# Patient Record
Sex: Male | Born: 1945 | Race: Black or African American | Hispanic: No | Smoking: Former smoker
Health system: Southern US, Community
[De-identification: ages and names within clinical notes are randomized; demographics above are authoritative.]

## PROBLEM LIST (undated history)

## (undated) DIAGNOSIS — L409 Psoriasis, unspecified: Secondary | ICD-10-CM

## (undated) DIAGNOSIS — I10 Essential (primary) hypertension: Secondary | ICD-10-CM

## (undated) HISTORY — PX: HERNIA REPAIR: SHX51

---

## 2010-01-25 ENCOUNTER — Emergency Department (HOSPITAL_COMMUNITY): Admission: EM | Admit: 2010-01-25 | Discharge: 2010-01-25 | Payer: Self-pay | Admitting: Emergency Medicine

## 2010-07-30 ENCOUNTER — Emergency Department (HOSPITAL_COMMUNITY): Admission: EM | Admit: 2010-07-30 | Discharge: 2010-07-30 | Payer: Self-pay | Admitting: Emergency Medicine

## 2011-01-25 LAB — RAPID URINE DRUG SCREEN, HOSP PERFORMED
Barbiturates: NOT DETECTED
Benzodiazepines: NOT DETECTED
Opiates: NOT DETECTED
Tetrahydrocannabinol: NOT DETECTED

## 2011-01-25 LAB — CBC
HCT: 40.3 % (ref 39.0–52.0)
Hemoglobin: 13.7 g/dL (ref 13.0–17.0)
RBC: 4.43 MIL/uL (ref 4.22–5.81)
WBC: 6.8 10*3/uL (ref 4.0–10.5)

## 2011-01-25 LAB — DIFFERENTIAL
Lymphocytes Relative: 42 % (ref 12–46)
Monocytes Absolute: 0.3 10*3/uL (ref 0.1–1.0)
Monocytes Relative: 5 % (ref 3–12)
Neutro Abs: 3.3 10*3/uL (ref 1.7–7.7)

## 2011-01-25 LAB — POCT CARDIAC MARKERS
CKMB, poc: <1 ng/mL — ABNORMAL LOW (ref 1.0–8.0)
Myoglobin, poc: 53.9 ng/mL (ref 12–200)

## 2011-01-25 LAB — BASIC METABOLIC PANEL
GFR calc non Af Amer: 51 mL/min — ABNORMAL LOW (ref >60)
Potassium: 3.7 mEq/L (ref 3.5–5.1)
Sodium: 135 mEq/L (ref 135–145)

## 2014-11-10 ENCOUNTER — Ambulatory Visit (HOSPITAL_COMMUNITY)
Admission: RE | Admit: 2014-11-10 | Discharge: 2014-11-10 | Disposition: A | Discharge status: Home / Self Care | Payer: Medicare Other | Source: Ambulatory Visit | Attending: Internal Medicine | Admitting: Internal Medicine

## 2014-11-10 ENCOUNTER — Other Ambulatory Visit (HOSPITAL_COMMUNITY): Payer: Self-pay | Admitting: Internal Medicine

## 2014-11-10 DIAGNOSIS — R05 Cough: Secondary | ICD-10-CM | POA: Diagnosis present

## 2014-11-10 DIAGNOSIS — J40 Bronchitis, not specified as acute or chronic: Secondary | ICD-10-CM

## 2015-02-07 DIAGNOSIS — J40 Bronchitis, not specified as acute or chronic: Secondary | ICD-10-CM | POA: Diagnosis not present

## 2015-02-07 DIAGNOSIS — M1990 Unspecified osteoarthritis, unspecified site: Secondary | ICD-10-CM | POA: Diagnosis not present

## 2015-02-09 DIAGNOSIS — Z1211 Encounter for screening for malignant neoplasm of colon: Secondary | ICD-10-CM | POA: Diagnosis not present

## 2015-05-09 DIAGNOSIS — J40 Bronchitis, not specified as acute or chronic: Secondary | ICD-10-CM | POA: Diagnosis not present

## 2015-05-09 DIAGNOSIS — M1990 Unspecified osteoarthritis, unspecified site: Secondary | ICD-10-CM | POA: Diagnosis not present

## 2015-08-08 DIAGNOSIS — J40 Bronchitis, not specified as acute or chronic: Secondary | ICD-10-CM | POA: Diagnosis not present

## 2015-08-08 DIAGNOSIS — Z23 Encounter for immunization: Secondary | ICD-10-CM | POA: Diagnosis not present

## 2015-08-08 DIAGNOSIS — M1990 Unspecified osteoarthritis, unspecified site: Secondary | ICD-10-CM | POA: Diagnosis not present

## 2015-10-17 DIAGNOSIS — J410 Simple chronic bronchitis: Secondary | ICD-10-CM | POA: Diagnosis not present

## 2015-10-17 DIAGNOSIS — Z23 Encounter for immunization: Secondary | ICD-10-CM | POA: Diagnosis not present

## 2016-05-07 ENCOUNTER — Other Ambulatory Visit (HOSPITAL_COMMUNITY): Payer: Self-pay | Admitting: Internal Medicine

## 2016-05-07 ENCOUNTER — Ambulatory Visit (HOSPITAL_COMMUNITY)
Admission: RE | Admit: 2016-05-07 | Discharge: 2016-05-07 | Disposition: A | Discharge status: Home / Self Care | Payer: Medicaid Other | Source: Ambulatory Visit | Attending: Internal Medicine | Admitting: Internal Medicine

## 2016-05-07 DIAGNOSIS — R0602 Shortness of breath: Secondary | ICD-10-CM | POA: Insufficient documentation

## 2016-05-08 ENCOUNTER — Other Ambulatory Visit (HOSPITAL_COMMUNITY): Payer: Self-pay | Admitting: Respiratory Therapy

## 2016-05-08 DIAGNOSIS — J441 Chronic obstructive pulmonary disease with (acute) exacerbation: Secondary | ICD-10-CM

## 2016-05-18 ENCOUNTER — Ambulatory Visit (HOSPITAL_COMMUNITY)
Admission: RE | Admit: 2016-05-18 | Discharge: 2016-05-18 | Disposition: A | Discharge status: Home / Self Care | Payer: Medicare Other | Source: Ambulatory Visit | Attending: Internal Medicine | Admitting: Internal Medicine

## 2016-05-18 DIAGNOSIS — J449 Chronic obstructive pulmonary disease, unspecified: Secondary | ICD-10-CM | POA: Diagnosis present

## 2016-05-18 DIAGNOSIS — J441 Chronic obstructive pulmonary disease with (acute) exacerbation: Secondary | ICD-10-CM

## 2016-05-18 LAB — SPIROMETRY WITH GRAPH
FEF 25-75 PRE: 1.26 L/s
FEF2575-%PRED-PRE: 66 %
FEV1-%Pred-Pre: 83 %
FEV1-Pre: 1.81 L
FEV1FVC-%PRED-PRE: 93 %
FEV6-%Pred-Pre: 91 %
FEV6-Pre: 2.54 L
FEV6FVC-%Pred-Pre: 105 %
FVC-%PRED-PRE: 86 %
FVC-PRE: 2.55 L
PRE FEV6/FVC RATIO: 99 %
Pre FEV1/FVC ratio: 71 %

## 2017-01-24 DIAGNOSIS — J449 Chronic obstructive pulmonary disease, unspecified: Secondary | ICD-10-CM | POA: Diagnosis not present

## 2017-01-24 DIAGNOSIS — M1990 Unspecified osteoarthritis, unspecified site: Secondary | ICD-10-CM | POA: Diagnosis not present

## 2017-01-24 DIAGNOSIS — R739 Hyperglycemia, unspecified: Secondary | ICD-10-CM | POA: Diagnosis not present

## 2017-01-24 DIAGNOSIS — J410 Simple chronic bronchitis: Secondary | ICD-10-CM | POA: Diagnosis not present

## 2017-01-24 DIAGNOSIS — I10 Essential (primary) hypertension: Secondary | ICD-10-CM | POA: Diagnosis not present

## 2017-01-24 DIAGNOSIS — C61 Malignant neoplasm of prostate: Secondary | ICD-10-CM | POA: Diagnosis not present

## 2017-05-21 DIAGNOSIS — I10 Essential (primary) hypertension: Secondary | ICD-10-CM | POA: Diagnosis not present

## 2017-05-21 DIAGNOSIS — J449 Chronic obstructive pulmonary disease, unspecified: Secondary | ICD-10-CM | POA: Diagnosis not present

## 2017-08-20 DIAGNOSIS — Z23 Encounter for immunization: Secondary | ICD-10-CM | POA: Diagnosis not present

## 2017-08-20 DIAGNOSIS — I10 Essential (primary) hypertension: Secondary | ICD-10-CM | POA: Diagnosis not present

## 2017-08-20 DIAGNOSIS — L259 Unspecified contact dermatitis, unspecified cause: Secondary | ICD-10-CM | POA: Diagnosis not present

## 2017-08-20 DIAGNOSIS — J449 Chronic obstructive pulmonary disease, unspecified: Secondary | ICD-10-CM | POA: Diagnosis not present

## 2017-08-30 IMAGING — DX DG CHEST 2V
2 series · 2 of 2 positions shown · non-contrast
Comparison: PA and lateral chest x-ray [DATE]

CLINICAL DATA: Recent onset of shortness of breath, productive
cough, and chest tightness; current smoker.

EXAM:
CHEST  2 VIEW

[chest pa]
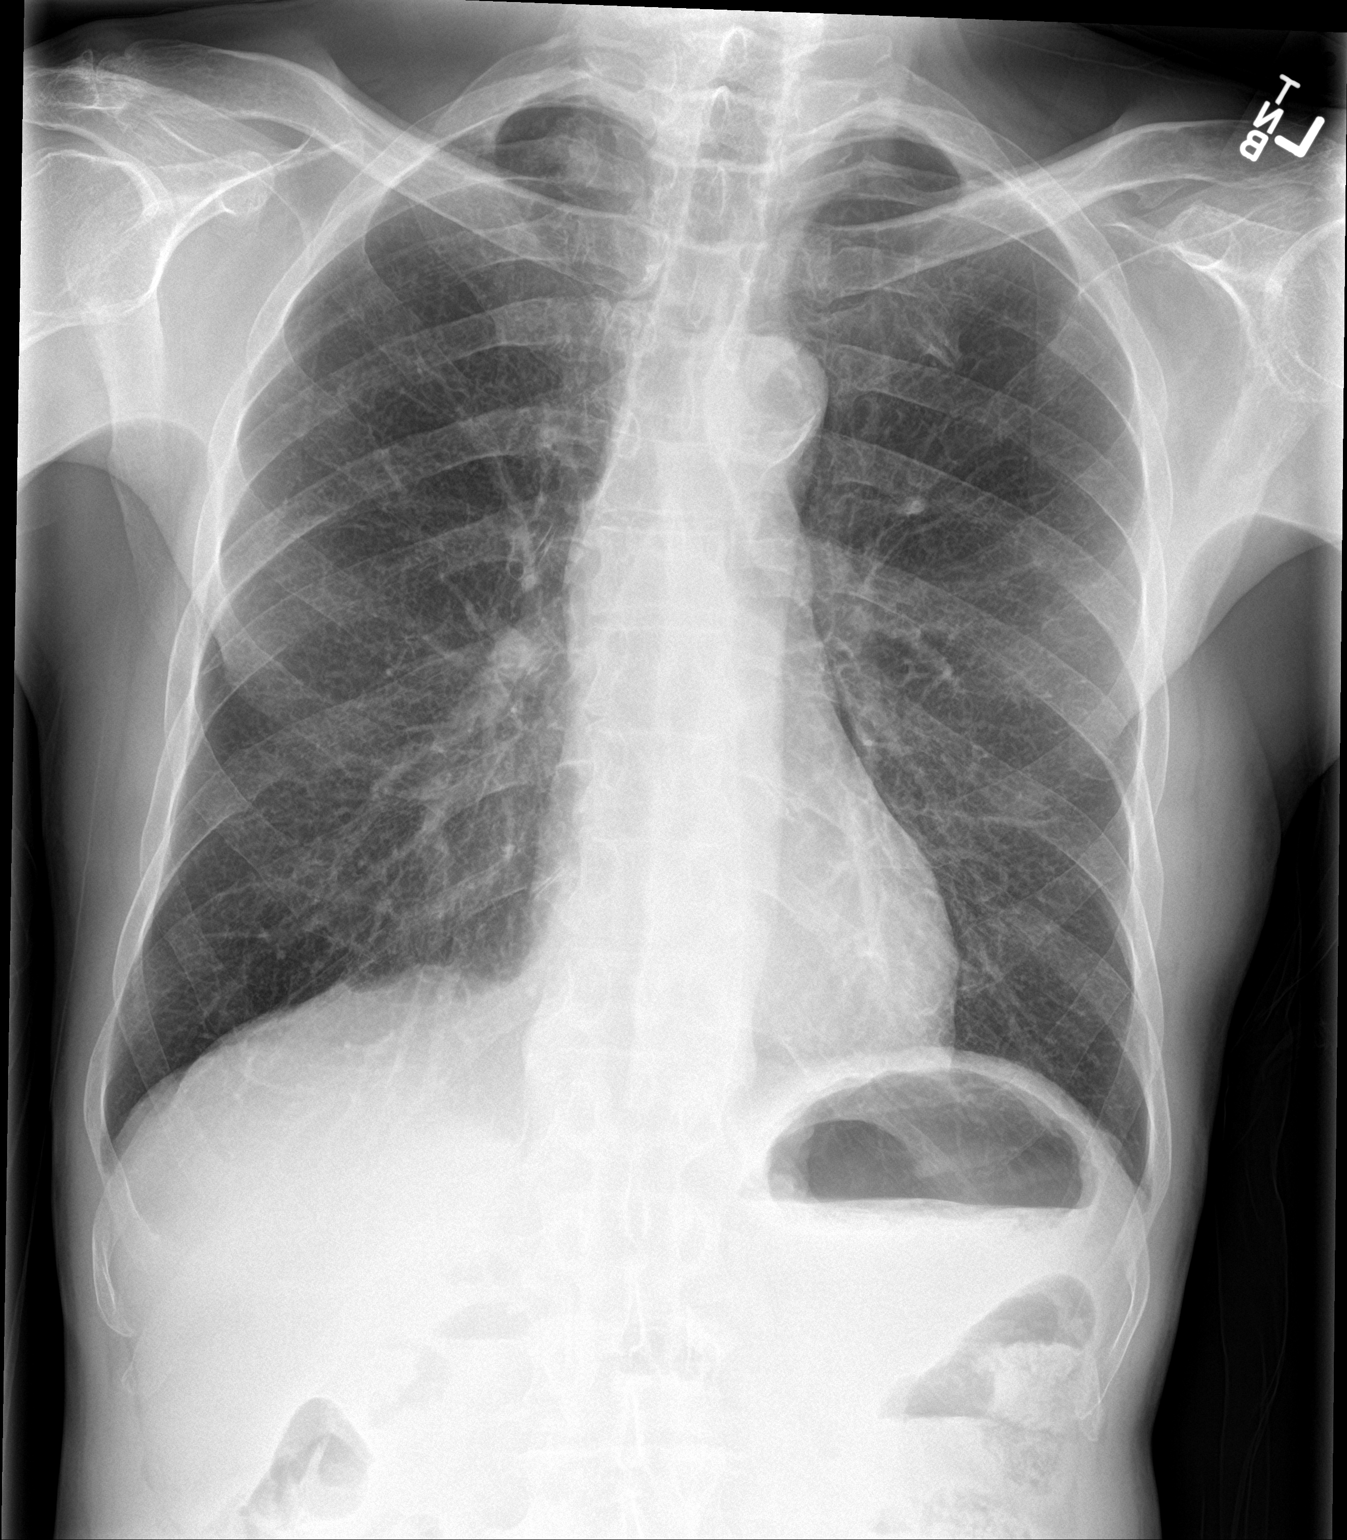

[chest lat]
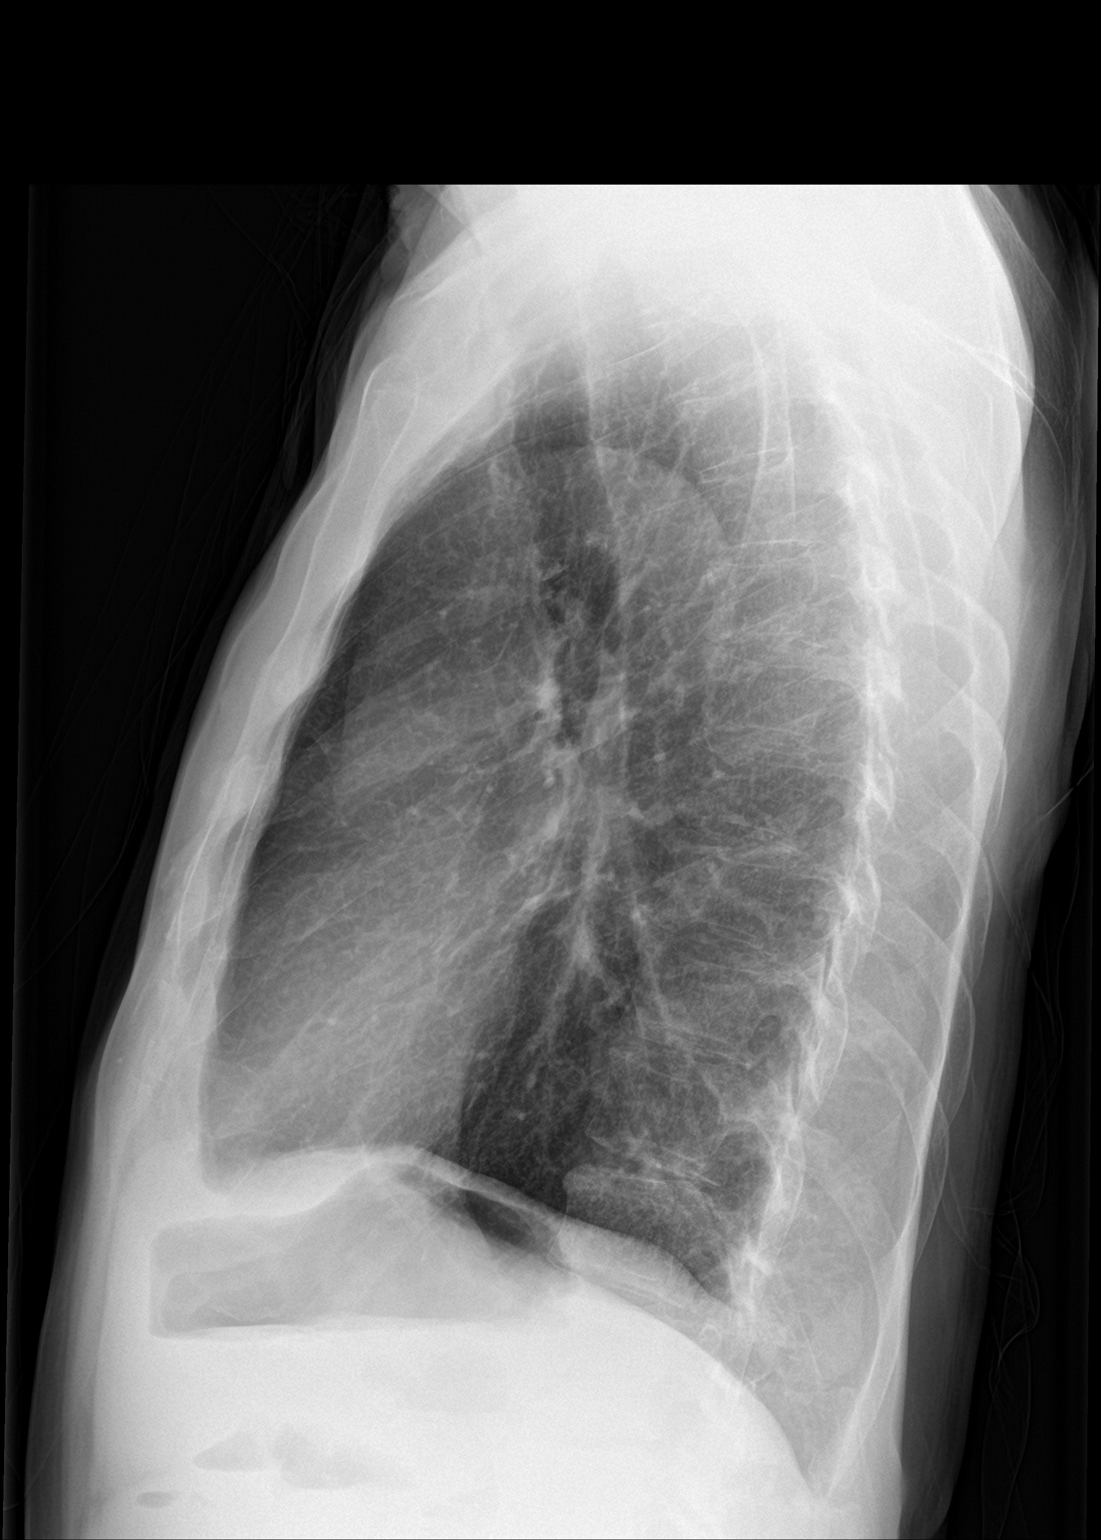

[2 of 2 positions shown; findings below may reference images not displayed]

FINDINGS: The lungs are mildly hyperinflated. The interstitial markings are
mildly increased. The heart and pulmonary vascularity are normal.
The mediastinum is normal in width. There is calcification in the
wall of the aortic arch. There is no pleural effusion. The bony
thorax exhibits no acute abnormality.
IMPRESSION: COPD/ reactive airway disease.  No evidence of pneumonia nor CHF.

Aortic atherosclerosis.

## 2017-12-10 DIAGNOSIS — J449 Chronic obstructive pulmonary disease, unspecified: Secondary | ICD-10-CM | POA: Diagnosis not present

## 2017-12-10 DIAGNOSIS — J410 Simple chronic bronchitis: Secondary | ICD-10-CM | POA: Diagnosis not present

## 2017-12-10 DIAGNOSIS — Z1331 Encounter for screening for depression: Secondary | ICD-10-CM | POA: Diagnosis not present

## 2017-12-10 DIAGNOSIS — C61 Malignant neoplasm of prostate: Secondary | ICD-10-CM | POA: Diagnosis not present

## 2017-12-10 DIAGNOSIS — Z1389 Encounter for screening for other disorder: Secondary | ICD-10-CM | POA: Diagnosis not present

## 2017-12-10 DIAGNOSIS — Z0000 Encounter for general adult medical examination without abnormal findings: Secondary | ICD-10-CM | POA: Diagnosis not present

## 2017-12-10 DIAGNOSIS — Z79899 Other long term (current) drug therapy: Secondary | ICD-10-CM | POA: Diagnosis not present

## 2017-12-10 DIAGNOSIS — I10 Essential (primary) hypertension: Secondary | ICD-10-CM | POA: Diagnosis not present

## 2017-12-10 DIAGNOSIS — Z0001 Encounter for general adult medical examination with abnormal findings: Secondary | ICD-10-CM | POA: Diagnosis not present

## 2017-12-10 DIAGNOSIS — R739 Hyperglycemia, unspecified: Secondary | ICD-10-CM | POA: Diagnosis not present

## 2017-12-10 DIAGNOSIS — M1990 Unspecified osteoarthritis, unspecified site: Secondary | ICD-10-CM | POA: Diagnosis not present

## 2017-12-26 ENCOUNTER — Ambulatory Visit (HOSPITAL_COMMUNITY)
Admission: RE | Admit: 2017-12-26 | Discharge: 2017-12-26 | Disposition: A | Discharge status: Home / Self Care | Payer: Medicare Other | Source: Ambulatory Visit | Attending: Internal Medicine | Admitting: Internal Medicine

## 2017-12-26 ENCOUNTER — Other Ambulatory Visit (HOSPITAL_COMMUNITY): Payer: Self-pay | Admitting: Internal Medicine

## 2017-12-26 DIAGNOSIS — M25551 Pain in right hip: Secondary | ICD-10-CM

## 2017-12-27 ENCOUNTER — Other Ambulatory Visit (HOSPITAL_COMMUNITY): Payer: Self-pay | Admitting: Internal Medicine

## 2017-12-27 DIAGNOSIS — M25551 Pain in right hip: Secondary | ICD-10-CM

## 2018-01-03 ENCOUNTER — Ambulatory Visit (HOSPITAL_COMMUNITY): Admission: RE | Admit: 2018-01-03 | Payer: Medicare Other | Source: Ambulatory Visit

## 2018-03-11 DIAGNOSIS — J449 Chronic obstructive pulmonary disease, unspecified: Secondary | ICD-10-CM | POA: Diagnosis not present

## 2018-03-11 DIAGNOSIS — I10 Essential (primary) hypertension: Secondary | ICD-10-CM | POA: Diagnosis not present

## 2018-06-10 DIAGNOSIS — J449 Chronic obstructive pulmonary disease, unspecified: Secondary | ICD-10-CM | POA: Diagnosis not present

## 2018-06-10 DIAGNOSIS — M1990 Unspecified osteoarthritis, unspecified site: Secondary | ICD-10-CM | POA: Diagnosis not present

## 2018-08-25 DIAGNOSIS — Z23 Encounter for immunization: Secondary | ICD-10-CM | POA: Diagnosis not present

## 2018-09-11 ENCOUNTER — Other Ambulatory Visit (HOSPITAL_COMMUNITY): Payer: Self-pay | Admitting: Internal Medicine

## 2018-09-11 DIAGNOSIS — J449 Chronic obstructive pulmonary disease, unspecified: Secondary | ICD-10-CM

## 2018-09-11 DIAGNOSIS — I10 Essential (primary) hypertension: Secondary | ICD-10-CM | POA: Diagnosis not present

## 2018-09-11 DIAGNOSIS — I719 Aortic aneurysm of unspecified site, without rupture: Secondary | ICD-10-CM

## 2018-09-17 ENCOUNTER — Ambulatory Visit (HOSPITAL_COMMUNITY)
Admission: RE | Admit: 2018-09-17 | Discharge: 2018-09-17 | Disposition: A | Discharge status: Home / Self Care | Payer: Medicare Other | Source: Ambulatory Visit | Attending: Internal Medicine | Admitting: Internal Medicine

## 2018-09-17 ENCOUNTER — Ambulatory Visit (HOSPITAL_COMMUNITY): Payer: Medicare Other

## 2018-09-17 DIAGNOSIS — J449 Chronic obstructive pulmonary disease, unspecified: Secondary | ICD-10-CM | POA: Insufficient documentation

## 2018-09-17 DIAGNOSIS — I700 Atherosclerosis of aorta: Secondary | ICD-10-CM | POA: Diagnosis not present

## 2018-09-17 DIAGNOSIS — Z122 Encounter for screening for malignant neoplasm of respiratory organs: Secondary | ICD-10-CM | POA: Insufficient documentation

## 2018-09-17 DIAGNOSIS — J439 Emphysema, unspecified: Secondary | ICD-10-CM | POA: Insufficient documentation

## 2018-09-17 DIAGNOSIS — Z136 Encounter for screening for cardiovascular disorders: Secondary | ICD-10-CM | POA: Diagnosis not present

## 2018-09-17 DIAGNOSIS — I719 Aortic aneurysm of unspecified site, without rupture: Secondary | ICD-10-CM

## 2018-09-24 DIAGNOSIS — I719 Aortic aneurysm of unspecified site, without rupture: Secondary | ICD-10-CM | POA: Diagnosis not present

## 2018-09-24 DIAGNOSIS — J449 Chronic obstructive pulmonary disease, unspecified: Secondary | ICD-10-CM | POA: Diagnosis not present

## 2018-10-01 ENCOUNTER — Ambulatory Visit (HOSPITAL_COMMUNITY): Payer: Medicare Other

## 2018-12-12 DIAGNOSIS — Z1389 Encounter for screening for other disorder: Secondary | ICD-10-CM | POA: Diagnosis not present

## 2018-12-12 DIAGNOSIS — I719 Aortic aneurysm of unspecified site, without rupture: Secondary | ICD-10-CM | POA: Diagnosis not present

## 2018-12-12 DIAGNOSIS — J410 Simple chronic bronchitis: Secondary | ICD-10-CM | POA: Diagnosis not present

## 2018-12-12 DIAGNOSIS — Z79899 Other long term (current) drug therapy: Secondary | ICD-10-CM | POA: Diagnosis not present

## 2018-12-12 DIAGNOSIS — M1990 Unspecified osteoarthritis, unspecified site: Secondary | ICD-10-CM | POA: Diagnosis not present

## 2018-12-12 DIAGNOSIS — R739 Hyperglycemia, unspecified: Secondary | ICD-10-CM | POA: Diagnosis not present

## 2018-12-12 DIAGNOSIS — J449 Chronic obstructive pulmonary disease, unspecified: Secondary | ICD-10-CM | POA: Diagnosis not present

## 2018-12-12 DIAGNOSIS — Z0000 Encounter for general adult medical examination without abnormal findings: Secondary | ICD-10-CM | POA: Diagnosis not present

## 2018-12-12 DIAGNOSIS — I10 Essential (primary) hypertension: Secondary | ICD-10-CM | POA: Diagnosis not present

## 2019-03-13 DIAGNOSIS — I10 Essential (primary) hypertension: Secondary | ICD-10-CM | POA: Diagnosis not present

## 2019-03-13 DIAGNOSIS — I719 Aortic aneurysm of unspecified site, without rupture: Secondary | ICD-10-CM | POA: Diagnosis not present

## 2019-03-13 DIAGNOSIS — J449 Chronic obstructive pulmonary disease, unspecified: Secondary | ICD-10-CM | POA: Diagnosis not present

## 2019-06-10 DIAGNOSIS — J449 Chronic obstructive pulmonary disease, unspecified: Secondary | ICD-10-CM | POA: Diagnosis not present

## 2019-06-10 DIAGNOSIS — I10 Essential (primary) hypertension: Secondary | ICD-10-CM | POA: Diagnosis not present

## 2019-06-10 DIAGNOSIS — I719 Aortic aneurysm of unspecified site, without rupture: Secondary | ICD-10-CM | POA: Diagnosis not present

## 2019-07-06 ENCOUNTER — Other Ambulatory Visit: Payer: Self-pay

## 2019-07-06 DIAGNOSIS — R6889 Other general symptoms and signs: Secondary | ICD-10-CM | POA: Diagnosis not present

## 2019-07-06 DIAGNOSIS — Z20822 Contact with and (suspected) exposure to covid-19: Secondary | ICD-10-CM

## 2019-07-07 LAB — NOVEL CORONAVIRUS, NAA: SARS-CoV-2, NAA: NOT DETECTED

## 2019-07-11 DIAGNOSIS — J449 Chronic obstructive pulmonary disease, unspecified: Secondary | ICD-10-CM | POA: Diagnosis not present

## 2019-07-11 DIAGNOSIS — I10 Essential (primary) hypertension: Secondary | ICD-10-CM | POA: Diagnosis not present

## 2019-08-11 DIAGNOSIS — J449 Chronic obstructive pulmonary disease, unspecified: Secondary | ICD-10-CM | POA: Diagnosis not present

## 2019-08-11 DIAGNOSIS — I10 Essential (primary) hypertension: Secondary | ICD-10-CM | POA: Diagnosis not present

## 2019-09-10 DIAGNOSIS — M1990 Unspecified osteoarthritis, unspecified site: Secondary | ICD-10-CM | POA: Diagnosis not present

## 2019-09-10 DIAGNOSIS — I10 Essential (primary) hypertension: Secondary | ICD-10-CM | POA: Diagnosis not present

## 2019-10-11 DIAGNOSIS — J449 Chronic obstructive pulmonary disease, unspecified: Secondary | ICD-10-CM | POA: Diagnosis not present

## 2019-10-11 DIAGNOSIS — I10 Essential (primary) hypertension: Secondary | ICD-10-CM | POA: Diagnosis not present

## 2019-10-27 DIAGNOSIS — I719 Aortic aneurysm of unspecified site, without rupture: Secondary | ICD-10-CM | POA: Diagnosis not present

## 2019-10-27 DIAGNOSIS — J449 Chronic obstructive pulmonary disease, unspecified: Secondary | ICD-10-CM | POA: Diagnosis not present

## 2019-10-27 DIAGNOSIS — M1990 Unspecified osteoarthritis, unspecified site: Secondary | ICD-10-CM | POA: Diagnosis not present

## 2019-10-27 DIAGNOSIS — I10 Essential (primary) hypertension: Secondary | ICD-10-CM | POA: Diagnosis not present

## 2019-11-16 DIAGNOSIS — I10 Essential (primary) hypertension: Secondary | ICD-10-CM | POA: Diagnosis not present

## 2020-01-07 DIAGNOSIS — F17210 Nicotine dependence, cigarettes, uncomplicated: Secondary | ICD-10-CM | POA: Diagnosis not present

## 2020-01-07 DIAGNOSIS — Z1389 Encounter for screening for other disorder: Secondary | ICD-10-CM | POA: Diagnosis not present

## 2020-01-07 DIAGNOSIS — Z0001 Encounter for general adult medical examination with abnormal findings: Secondary | ICD-10-CM | POA: Diagnosis not present

## 2020-01-07 DIAGNOSIS — I10 Essential (primary) hypertension: Secondary | ICD-10-CM | POA: Diagnosis not present

## 2020-01-07 DIAGNOSIS — I719 Aortic aneurysm of unspecified site, without rupture: Secondary | ICD-10-CM | POA: Diagnosis not present

## 2020-01-08 DIAGNOSIS — J449 Chronic obstructive pulmonary disease, unspecified: Secondary | ICD-10-CM | POA: Diagnosis not present

## 2020-01-08 DIAGNOSIS — I10 Essential (primary) hypertension: Secondary | ICD-10-CM | POA: Diagnosis not present

## 2020-01-08 DIAGNOSIS — Z0001 Encounter for general adult medical examination with abnormal findings: Secondary | ICD-10-CM | POA: Diagnosis not present

## 2020-01-23 ENCOUNTER — Ambulatory Visit: Payer: Medicare Other

## 2020-02-04 ENCOUNTER — Ambulatory Visit: Payer: Medicare Other | Attending: Internal Medicine

## 2020-02-04 DIAGNOSIS — I10 Essential (primary) hypertension: Secondary | ICD-10-CM | POA: Diagnosis not present

## 2020-02-04 DIAGNOSIS — Z23 Encounter for immunization: Secondary | ICD-10-CM

## 2020-02-04 DIAGNOSIS — J449 Chronic obstructive pulmonary disease, unspecified: Secondary | ICD-10-CM | POA: Diagnosis not present

## 2020-02-04 NOTE — Progress Notes (Signed)
   Covid-19 Vaccination Clinic  Name:  Kenneth [name]    MRN: [id number] DOB: [birth year]  02/04/2020  Mr. [name] was observed post Covid-19 immunization for 15 minutes without incident. He was provided with Vaccine Information Sheet and instruction to access the V-Safe system.   [name] was instructed to call 911 with any severe reactions post vaccine: Marland Kitchen Difficulty breathing  . Swelling of face and throat  . A fast heartbeat  . A bad rash all over body  . Dizziness and weakness   Immunizations Administered    Name Date Dose VIS Date Route   Moderna COVID-19 Vaccine 02/04/2020 11:33 AM 0.5 mL 10/13/2019 Intramuscular   Manufacturer: Moderna   Lot: [id number]   [id number]

## 2020-03-03 ENCOUNTER — Ambulatory Visit: Payer: Medicare Other | Attending: Internal Medicine

## 2020-03-03 DIAGNOSIS — Z23 Encounter for immunization: Secondary | ICD-10-CM

## 2020-03-03 NOTE — Progress Notes (Signed)
   Covid-19 Vaccination Clinic  Name:  Jaryn Zetterberg    MRN: [id number] DOB: 09/24/1946  03/03/2020  Mr. Farias was observed post Covid-19 immunization for 15 minutes without incident. He was provided with Vaccine Information Sheet and instruction to access the V-Safe system.   Mr. Ings was instructed to call 911 with any severe reactions post vaccine: Marland Kitchen Difficulty breathing  . Swelling of face and throat  . A fast heartbeat  . A bad rash all over body  . Dizziness and weakness   Immunizations Administered    Name Date Dose VIS Date Route   Moderna COVID-19 Vaccine 03/03/2020 11:01 AM 0.5 mL 10/2019 Intramuscular   Manufacturer: Moderna   Lot: [id number]   [id number]

## 2020-03-06 DIAGNOSIS — M1990 Unspecified osteoarthritis, unspecified site: Secondary | ICD-10-CM | POA: Diagnosis not present

## 2020-03-06 DIAGNOSIS — I10 Essential (primary) hypertension: Secondary | ICD-10-CM | POA: Diagnosis not present

## 2020-04-05 DIAGNOSIS — I10 Essential (primary) hypertension: Secondary | ICD-10-CM | POA: Diagnosis not present

## 2020-04-05 DIAGNOSIS — M1990 Unspecified osteoarthritis, unspecified site: Secondary | ICD-10-CM | POA: Diagnosis not present

## 2020-04-15 DIAGNOSIS — M1990 Unspecified osteoarthritis, unspecified site: Secondary | ICD-10-CM | POA: Diagnosis not present

## 2020-04-15 DIAGNOSIS — I714 Abdominal aortic aneurysm, without rupture: Secondary | ICD-10-CM | POA: Diagnosis not present

## 2020-04-15 DIAGNOSIS — J449 Chronic obstructive pulmonary disease, unspecified: Secondary | ICD-10-CM | POA: Diagnosis not present

## 2020-05-15 DIAGNOSIS — J449 Chronic obstructive pulmonary disease, unspecified: Secondary | ICD-10-CM | POA: Diagnosis not present

## 2020-05-15 DIAGNOSIS — I10 Essential (primary) hypertension: Secondary | ICD-10-CM | POA: Diagnosis not present

## 2020-06-15 DIAGNOSIS — J449 Chronic obstructive pulmonary disease, unspecified: Secondary | ICD-10-CM | POA: Diagnosis not present

## 2020-06-15 DIAGNOSIS — I10 Essential (primary) hypertension: Secondary | ICD-10-CM | POA: Diagnosis not present

## 2020-07-28 DIAGNOSIS — Z23 Encounter for immunization: Secondary | ICD-10-CM | POA: Diagnosis not present

## 2020-07-28 DIAGNOSIS — J449 Chronic obstructive pulmonary disease, unspecified: Secondary | ICD-10-CM | POA: Diagnosis not present

## 2020-07-28 DIAGNOSIS — I10 Essential (primary) hypertension: Secondary | ICD-10-CM | POA: Diagnosis not present

## 2020-07-28 DIAGNOSIS — F17200 Nicotine dependence, unspecified, uncomplicated: Secondary | ICD-10-CM | POA: Diagnosis not present

## 2020-08-27 DIAGNOSIS — J449 Chronic obstructive pulmonary disease, unspecified: Secondary | ICD-10-CM | POA: Diagnosis not present

## 2020-08-27 DIAGNOSIS — M1990 Unspecified osteoarthritis, unspecified site: Secondary | ICD-10-CM | POA: Diagnosis not present

## 2020-09-27 DIAGNOSIS — I10 Essential (primary) hypertension: Secondary | ICD-10-CM | POA: Diagnosis not present

## 2020-09-27 DIAGNOSIS — J449 Chronic obstructive pulmonary disease, unspecified: Secondary | ICD-10-CM | POA: Diagnosis not present

## 2020-11-02 DIAGNOSIS — J449 Chronic obstructive pulmonary disease, unspecified: Secondary | ICD-10-CM | POA: Diagnosis not present

## 2020-11-02 DIAGNOSIS — I10 Essential (primary) hypertension: Secondary | ICD-10-CM | POA: Diagnosis not present

## 2020-12-03 DIAGNOSIS — J449 Chronic obstructive pulmonary disease, unspecified: Secondary | ICD-10-CM | POA: Diagnosis not present

## 2020-12-03 DIAGNOSIS — F17200 Nicotine dependence, unspecified, uncomplicated: Secondary | ICD-10-CM | POA: Diagnosis not present

## 2021-01-09 DIAGNOSIS — I719 Aortic aneurysm of unspecified site, without rupture: Secondary | ICD-10-CM | POA: Diagnosis not present

## 2021-01-09 DIAGNOSIS — Z0001 Encounter for general adult medical examination with abnormal findings: Secondary | ICD-10-CM | POA: Diagnosis not present

## 2021-01-09 DIAGNOSIS — I10 Essential (primary) hypertension: Secondary | ICD-10-CM | POA: Diagnosis not present

## 2021-01-09 DIAGNOSIS — Z1389 Encounter for screening for other disorder: Secondary | ICD-10-CM | POA: Diagnosis not present

## 2021-01-09 DIAGNOSIS — J449 Chronic obstructive pulmonary disease, unspecified: Secondary | ICD-10-CM | POA: Diagnosis not present

## 2021-01-09 DIAGNOSIS — F17210 Nicotine dependence, cigarettes, uncomplicated: Secondary | ICD-10-CM | POA: Diagnosis not present

## 2021-01-10 DIAGNOSIS — Z0001 Encounter for general adult medical examination with abnormal findings: Secondary | ICD-10-CM | POA: Diagnosis not present

## 2021-01-10 DIAGNOSIS — I10 Essential (primary) hypertension: Secondary | ICD-10-CM | POA: Diagnosis not present

## 2021-01-10 DIAGNOSIS — Z79899 Other long term (current) drug therapy: Secondary | ICD-10-CM | POA: Diagnosis not present

## 2021-02-06 DIAGNOSIS — I10 Essential (primary) hypertension: Secondary | ICD-10-CM | POA: Diagnosis not present

## 2021-02-06 DIAGNOSIS — J449 Chronic obstructive pulmonary disease, unspecified: Secondary | ICD-10-CM | POA: Diagnosis not present

## 2021-03-09 DIAGNOSIS — F17210 Nicotine dependence, cigarettes, uncomplicated: Secondary | ICD-10-CM | POA: Diagnosis not present

## 2021-03-09 DIAGNOSIS — J449 Chronic obstructive pulmonary disease, unspecified: Secondary | ICD-10-CM | POA: Diagnosis not present

## 2021-04-08 DIAGNOSIS — F17200 Nicotine dependence, unspecified, uncomplicated: Secondary | ICD-10-CM | POA: Diagnosis not present

## 2021-04-08 DIAGNOSIS — I10 Essential (primary) hypertension: Secondary | ICD-10-CM | POA: Diagnosis not present

## 2021-05-09 DIAGNOSIS — J449 Chronic obstructive pulmonary disease, unspecified: Secondary | ICD-10-CM | POA: Diagnosis not present

## 2021-05-09 DIAGNOSIS — F17210 Nicotine dependence, cigarettes, uncomplicated: Secondary | ICD-10-CM | POA: Diagnosis not present

## 2021-06-08 DIAGNOSIS — F17210 Nicotine dependence, cigarettes, uncomplicated: Secondary | ICD-10-CM | POA: Diagnosis not present

## 2021-06-08 DIAGNOSIS — I10 Essential (primary) hypertension: Secondary | ICD-10-CM | POA: Diagnosis not present

## 2021-06-27 DIAGNOSIS — I10 Essential (primary) hypertension: Secondary | ICD-10-CM | POA: Diagnosis not present

## 2021-06-27 DIAGNOSIS — F17200 Nicotine dependence, unspecified, uncomplicated: Secondary | ICD-10-CM | POA: Diagnosis not present

## 2021-06-27 DIAGNOSIS — J449 Chronic obstructive pulmonary disease, unspecified: Secondary | ICD-10-CM | POA: Diagnosis not present

## 2021-06-27 DIAGNOSIS — M1990 Unspecified osteoarthritis, unspecified site: Secondary | ICD-10-CM | POA: Diagnosis not present

## 2021-07-28 DIAGNOSIS — J449 Chronic obstructive pulmonary disease, unspecified: Secondary | ICD-10-CM | POA: Diagnosis not present

## 2021-07-28 DIAGNOSIS — I10 Essential (primary) hypertension: Secondary | ICD-10-CM | POA: Diagnosis not present

## 2021-08-27 DIAGNOSIS — I10 Essential (primary) hypertension: Secondary | ICD-10-CM | POA: Diagnosis not present

## 2021-08-27 DIAGNOSIS — J449 Chronic obstructive pulmonary disease, unspecified: Secondary | ICD-10-CM | POA: Diagnosis not present

## 2021-09-27 DIAGNOSIS — I10 Essential (primary) hypertension: Secondary | ICD-10-CM | POA: Diagnosis not present

## 2021-09-27 DIAGNOSIS — F17200 Nicotine dependence, unspecified, uncomplicated: Secondary | ICD-10-CM | POA: Diagnosis not present

## 2021-10-27 DIAGNOSIS — F17200 Nicotine dependence, unspecified, uncomplicated: Secondary | ICD-10-CM | POA: Diagnosis not present

## 2021-10-27 DIAGNOSIS — I10 Essential (primary) hypertension: Secondary | ICD-10-CM | POA: Diagnosis not present

## 2021-11-27 DIAGNOSIS — I10 Essential (primary) hypertension: Secondary | ICD-10-CM | POA: Diagnosis not present

## 2021-11-27 DIAGNOSIS — F17210 Nicotine dependence, cigarettes, uncomplicated: Secondary | ICD-10-CM | POA: Diagnosis not present

## 2021-12-28 DIAGNOSIS — M1990 Unspecified osteoarthritis, unspecified site: Secondary | ICD-10-CM | POA: Diagnosis not present

## 2021-12-28 DIAGNOSIS — I10 Essential (primary) hypertension: Secondary | ICD-10-CM | POA: Diagnosis not present

## 2022-02-08 DIAGNOSIS — M1990 Unspecified osteoarthritis, unspecified site: Secondary | ICD-10-CM | POA: Diagnosis not present

## 2022-02-08 DIAGNOSIS — Z0001 Encounter for general adult medical examination with abnormal findings: Secondary | ICD-10-CM | POA: Diagnosis not present

## 2022-02-08 DIAGNOSIS — I10 Essential (primary) hypertension: Secondary | ICD-10-CM | POA: Diagnosis not present

## 2022-02-08 DIAGNOSIS — Z1389 Encounter for screening for other disorder: Secondary | ICD-10-CM | POA: Diagnosis not present

## 2022-02-08 DIAGNOSIS — J449 Chronic obstructive pulmonary disease, unspecified: Secondary | ICD-10-CM | POA: Diagnosis not present

## 2022-02-08 DIAGNOSIS — I719 Aortic aneurysm of unspecified site, without rupture: Secondary | ICD-10-CM | POA: Diagnosis not present

## 2022-02-08 DIAGNOSIS — F17210 Nicotine dependence, cigarettes, uncomplicated: Secondary | ICD-10-CM | POA: Diagnosis not present

## 2022-03-11 DIAGNOSIS — M1990 Unspecified osteoarthritis, unspecified site: Secondary | ICD-10-CM | POA: Diagnosis not present

## 2022-03-11 DIAGNOSIS — I10 Essential (primary) hypertension: Secondary | ICD-10-CM | POA: Diagnosis not present

## 2022-04-10 DIAGNOSIS — F17210 Nicotine dependence, cigarettes, uncomplicated: Secondary | ICD-10-CM | POA: Diagnosis not present

## 2022-04-10 DIAGNOSIS — I10 Essential (primary) hypertension: Secondary | ICD-10-CM | POA: Diagnosis not present

## 2022-05-11 DIAGNOSIS — M1990 Unspecified osteoarthritis, unspecified site: Secondary | ICD-10-CM | POA: Diagnosis not present

## 2022-05-11 DIAGNOSIS — I10 Essential (primary) hypertension: Secondary | ICD-10-CM | POA: Diagnosis not present

## 2022-06-10 DIAGNOSIS — I10 Essential (primary) hypertension: Secondary | ICD-10-CM | POA: Diagnosis not present

## 2022-06-10 DIAGNOSIS — J441 Chronic obstructive pulmonary disease with (acute) exacerbation: Secondary | ICD-10-CM | POA: Diagnosis not present

## 2022-07-11 DIAGNOSIS — J440 Chronic obstructive pulmonary disease with acute lower respiratory infection: Secondary | ICD-10-CM | POA: Diagnosis not present

## 2022-07-11 DIAGNOSIS — I10 Essential (primary) hypertension: Secondary | ICD-10-CM | POA: Diagnosis not present

## 2022-07-25 DIAGNOSIS — I10 Essential (primary) hypertension: Secondary | ICD-10-CM | POA: Diagnosis not present

## 2022-07-25 DIAGNOSIS — J449 Chronic obstructive pulmonary disease, unspecified: Secondary | ICD-10-CM | POA: Diagnosis not present

## 2022-07-25 DIAGNOSIS — F17210 Nicotine dependence, cigarettes, uncomplicated: Secondary | ICD-10-CM | POA: Diagnosis not present

## 2022-07-25 DIAGNOSIS — F17200 Nicotine dependence, unspecified, uncomplicated: Secondary | ICD-10-CM | POA: Diagnosis not present

## 2022-07-25 DIAGNOSIS — Z23 Encounter for immunization: Secondary | ICD-10-CM | POA: Diagnosis not present

## 2022-11-28 ENCOUNTER — Other Ambulatory Visit: Payer: Self-pay

## 2022-11-28 ENCOUNTER — Emergency Department (HOSPITAL_COMMUNITY): Payer: 59

## 2022-11-28 ENCOUNTER — Emergency Department (HOSPITAL_COMMUNITY)
Admission: EM | Admit: 2022-11-28 | Discharge: 2022-11-28 | Disposition: A | Discharge status: Home / Self Care | Payer: 59 | Attending: Emergency Medicine | Admitting: Emergency Medicine

## 2022-11-28 ENCOUNTER — Encounter (HOSPITAL_COMMUNITY): Payer: Self-pay

## 2022-11-28 DIAGNOSIS — R059 Cough, unspecified: Secondary | ICD-10-CM | POA: Insufficient documentation

## 2022-11-28 DIAGNOSIS — Z79899 Other long term (current) drug therapy: Secondary | ICD-10-CM | POA: Diagnosis not present

## 2022-11-28 DIAGNOSIS — R600 Localized edema: Secondary | ICD-10-CM | POA: Insufficient documentation

## 2022-11-28 DIAGNOSIS — Z87891 Personal history of nicotine dependence: Secondary | ICD-10-CM | POA: Insufficient documentation

## 2022-11-28 DIAGNOSIS — M7989 Other specified soft tissue disorders: Secondary | ICD-10-CM | POA: Diagnosis not present

## 2022-11-28 DIAGNOSIS — R21 Rash and other nonspecific skin eruption: Secondary | ICD-10-CM | POA: Diagnosis not present

## 2022-11-28 DIAGNOSIS — R062 Wheezing: Secondary | ICD-10-CM | POA: Diagnosis not present

## 2022-11-28 DIAGNOSIS — R109 Unspecified abdominal pain: Secondary | ICD-10-CM | POA: Insufficient documentation

## 2022-11-28 DIAGNOSIS — M79671 Pain in right foot: Secondary | ICD-10-CM | POA: Diagnosis not present

## 2022-11-28 DIAGNOSIS — I10 Essential (primary) hypertension: Secondary | ICD-10-CM | POA: Diagnosis not present

## 2022-11-28 DIAGNOSIS — R0602 Shortness of breath: Secondary | ICD-10-CM | POA: Insufficient documentation

## 2022-11-28 DIAGNOSIS — R609 Edema, unspecified: Secondary | ICD-10-CM

## 2022-11-28 HISTORY — DX: Essential (primary) hypertension: I10

## 2022-11-28 LAB — TROPONIN I (HIGH SENSITIVITY): Troponin I (High Sensitivity): 4 ng/L (ref <18)

## 2022-11-28 LAB — CBC WITH DIFFERENTIAL/PLATELET
Abs Immature Granulocytes: 0.02 10*3/uL (ref 0.00–0.07)
Basophils Absolute: 0 10*3/uL (ref 0.0–0.1)
Basophils Relative: 1 %
Eosinophils Absolute: 2.2 10*3/uL — ABNORMAL HIGH (ref 0.0–0.5)
Eosinophils Relative: 34 %
HCT: 38.4 % — ABNORMAL LOW (ref 39.0–52.0)
Hemoglobin: 12.2 g/dL — ABNORMAL LOW (ref 13.0–17.0)
Immature Granulocytes: 0 %
Lymphocytes Relative: 19 %
Lymphs Abs: 1.2 10*3/uL (ref 0.7–4.0)
MCH: 29.2 pg (ref 26.0–34.0)
MCHC: 31.8 g/dL (ref 30.0–36.0)
MCV: 91.9 fL (ref 80.0–100.0)
Monocytes Absolute: 0.5 10*3/uL (ref 0.1–1.0)
Monocytes Relative: 7 %
Neutro Abs: 2.6 10*3/uL (ref 1.7–7.7)
Neutrophils Relative %: 39 %
Platelets: 315 10*3/uL (ref 150–400)
RBC: 4.18 MIL/uL — ABNORMAL LOW (ref 4.22–5.81)
RDW: 13.4 % (ref 11.5–15.5)
WBC: 6.6 10*3/uL (ref 4.0–10.5)
nRBC: 0 % (ref 0.0–0.2)

## 2022-11-28 LAB — HEPATIC FUNCTION PANEL
ALT: 28 U/L (ref 0–44)
AST: 29 U/L (ref 15–41)
Albumin: 3.5 g/dL (ref 3.5–5.0)
Alkaline Phosphatase: 76 U/L (ref 38–126)
Bilirubin, Direct: 0.1 mg/dL (ref 0.0–0.2)
Indirect Bilirubin: 0.6 mg/dL (ref 0.3–0.9)
Total Bilirubin: 0.7 mg/dL (ref 0.3–1.2)
Total Protein: 6.5 g/dL (ref 6.5–8.1)

## 2022-11-28 LAB — BASIC METABOLIC PANEL
Anion gap: 8 (ref 5–15)
BUN: 24 mg/dL — ABNORMAL HIGH (ref 8–23)
CO2: 23 mmol/L (ref 22–32)
Calcium: 8.7 mg/dL — ABNORMAL LOW (ref 8.9–10.3)
Chloride: 108 mmol/L (ref 98–111)
Creatinine, Ser: 1 mg/dL (ref 0.61–1.24)
GFR, Estimated: >60 mL/min (ref >60)
Glucose, Bld: 95 mg/dL (ref 70–99)
Potassium: 4.2 mmol/L (ref 3.5–5.1)
Sodium: 139 mmol/L (ref 135–145)

## 2022-11-28 LAB — BRAIN NATRIURETIC PEPTIDE: B Natriuretic Peptide: 83 pg/mL (ref 0.0–100.0)

## 2022-11-28 MED ORDER — METHYLPREDNISOLONE SODIUM SUCC 125 MG IJ SOLR
125.0000 mg | Freq: Once | INTRAMUSCULAR | Status: AC
  Administered 2022-11-28: 125 mg via INTRAVENOUS
  Filled 2022-11-28: qty 2

## 2022-11-28 MED ORDER — IPRATROPIUM-ALBUTEROL 0.5-2.5 (3) MG/3ML IN SOLN
3.0000 mL | Freq: Once | RESPIRATORY_TRACT | Status: AC
  Administered 2022-11-28: 3 mL via RESPIRATORY_TRACT
  Filled 2022-11-28: qty 3

## 2022-11-28 MED ORDER — FUROSEMIDE 10 MG/ML IJ SOLN
20.0000 mg | Freq: Once | INTRAMUSCULAR | Status: AC
  Administered 2022-11-28: 20 mg via INTRAVENOUS
  Filled 2022-11-28: qty 2

## 2022-11-28 MED ORDER — ALBUTEROL SULFATE HFA 108 (90 BASE) MCG/ACT IN AERS
2.0000 | INHALATION_SPRAY | RESPIRATORY_TRACT | Status: DC | PRN
  Administered 2022-11-28: 2 via RESPIRATORY_TRACT
  Filled 2022-11-28: qty 6.7

## 2022-11-28 MED ORDER — FUROSEMIDE 20 MG PO TABS: 20.0000 mg | ORAL_TABLET | Freq: Every day | ORAL | 0 refills | Status: DC | PRN

## 2022-11-28 MED ORDER — AEROCHAMBER PLUS FLO-VU MEDIUM MISC
1.0000 | Freq: Once | Status: AC
  Administered 2022-11-28: 1
  Filled 2022-11-28: qty 10
  Filled 2022-11-28: qty 1

## 2022-11-28 MED ORDER — AQUAPHOR EX OINT: TOPICAL_OINTMENT | CUTANEOUS | 0 refills | Status: AC | PRN

## 2022-11-28 MED ORDER — IOHEXOL 350 MG/ML SOLN
100.0000 mL | Freq: Once | INTRAVENOUS | Status: AC | PRN
  Administered 2022-11-28: 100 mL via INTRAVENOUS

## 2022-11-28 NOTE — ED Triage Notes (Signed)
Pt reports leg swelling, shortness of breath and a rash x 1 week.

## 2022-11-28 NOTE — ED Provider Notes (Signed)
[facility] EMERGENCY DEPARTMENT Provider Note   CSN: [id number] Arrival date & time: 11/28/22  1137     History  Chief Complaint  Patient presents with   Leg Swelling    Kenneth [name] is a 77 y.o. male with a history of hypertension only, on amlodipine presenting with multiple complaints, initially he describes a 1 week history of bilateral lower extremity swelling along with discomfort in his legs and difficulty walking secondary to swelling.  Additionally he has specific pain in his right foot which is worse with weightbearing, which also started last week.  He denies any injuries or falls.  He has also noticed increased shortness of breath along with wheezing.  Patient is a former smoker, he does drink alcohol.  He has had a cough, nonproductive.  He denies chest pain.  His shortness of breath is worse with exertion, better at rest.  He denies orthopnea.  He also has developed a rash on his body, worse on his arms and his anterior trunk, describes severe itching intermittently.  He has found no alleviators, has been on no medications for his symptoms.  He has not seen his primary provider for the symptoms.  He denies history of eczema or other skin problems.  He has had no recent exposures to new soaps, lotions, laundry detergents.  He has been applying Vaseline to his skin which will help fleetingly relieve his rash symptoms.  The history is provided by the patient.       Home Medications Prior to Admission medications   Medication Sig Start Date End Date Taking? Authorizing Provider  albuterol (VENTOLIN HFA) 108 (90 Base) MCG/ACT inhaler Inhale 2 puffs into the lungs every 6 (six) hours as needed for shortness of breath. 10/01/22  Yes [provider]  amLODipine (NORVASC) 5 MG tablet Take 5 mg by mouth daily.   Yes [provider]  furosemide (LASIX) 20 MG tablet Take 1 tablet (20 mg total) by mouth daily as needed (lower extremity swelling). 11/28/22  Yes Amarrah Meinhart,  Almyra Free, PA-C  predniSONE (DELTASONE) 10 MG tablet Take 10 mg by mouth daily. 6,5,4,3,2,1 taper dose Patient not taking: Reported on 11/28/2022 11/22/22   [provider]      Allergies    Patient has no known allergies.    Review of Systems   Review of Systems  Constitutional:  Negative for chills and fever.  HENT:  Negative for congestion and sore throat.   Eyes: Negative.   Respiratory:  Positive for shortness of breath and wheezing. Negative for chest tightness.   Cardiovascular:  Positive for leg swelling. Negative for chest pain.  Gastrointestinal:  Negative for abdominal pain and nausea.  Genitourinary: Negative.  Negative for hematuria, penile pain, penile swelling and testicular pain.  Musculoskeletal:  Positive for arthralgias. Negative for joint swelling and neck pain.  Skin:  Positive for rash. Negative for wound.  Neurological:  Negative for dizziness, weakness, light-headedness, numbness and headaches.  Psychiatric/Behavioral: Negative.      Physical Exam Updated Vital Signs BP (!) 153/104   Pulse 85   Temp 98.4 F (36.9 C) (Oral)   Resp 17   Ht '5\' 3"'$  (1.6 m)   Wt 65.3 kg   SpO2 98%   BMI 25.51 kg/m  Physical Exam Vitals and nursing note reviewed. Exam conducted with a chaperone present.  Constitutional:      Appearance: He is well-developed.  HENT:     Head: Normocephalic and atraumatic.  Eyes:  Conjunctiva/sclera: Conjunctivae normal.  Cardiovascular:     Rate and Rhythm: Normal rate and regular rhythm.     Heart sounds: Normal heart sounds.  Pulmonary:     Effort: Pulmonary effort is normal.     Breath sounds: No stridor. Wheezing present. No rhonchi.  Abdominal:     General: Bowel sounds are normal.     Palpations: Abdomen is soft.     Tenderness: There is no abdominal tenderness. There is no guarding.  Genitourinary:    Penis: Normal and uncircumcised. No erythema, tenderness, discharge or swelling.      Testes: Normal.     Comments:  Shotty tender bilateral inguinal lymph nodes.  No groin rash or lesions.  Musculoskeletal:        General: Normal range of motion.     Cervical back: Normal range of motion.     Right lower leg: Edema present.     Left lower leg: Edema present.     Right foot: Bony tenderness present.     Comments: Tender to palpation right medial dorsal foot.  There is no palpable deformities.  Pedal pulses are intact.  He does have uniformly edematous bilateral feet and lower extremities.  Skin:    General: Skin is warm and dry.  Neurological:     Mental Status: He is alert.     ED Results / Procedures / Treatments   Labs (all labs ordered are listed, but only abnormal results are displayed) Labs Reviewed  BASIC METABOLIC PANEL - Abnormal; Notable for the following components:      Result Value   BUN 24 (*)    Calcium 8.7 (*)    All other components within normal limits  CBC WITH DIFFERENTIAL/PLATELET - Abnormal; Notable for the following components:   RBC 4.18 (*)    Hemoglobin 12.2 (*)    HCT 38.4 (*)    Eosinophils Absolute 2.2 (*)    All other components within normal limits  BRAIN NATRIURETIC PEPTIDE  HEPATIC FUNCTION PANEL  TROPONIN I (HIGH SENSITIVITY)    EKG EKG Interpretation  Date/Time:  Wednesday November 28 2022 13:19:40 EST Ventricular Rate:  71 PR Interval:  180 QRS Duration: 95 QT Interval:  430 QTC Calculation: 458 R Axis:   32 Text Interpretation: Sinus rhythm Probable anteroseptal infarct, old Confirmed by Garnette Gunner [phone]) on 11/28/2022 1:39:09 PM  Radiology CT ABDOMEN PELVIS W CONTRAST  Result Date: 11/28/2022 CLINICAL DATA:  Left upper quadrant pain EXAM: CT ABDOMEN AND PELVIS WITH CONTRAST TECHNIQUE: Multidetector CT imaging of the abdomen and pelvis was performed using the standard protocol following bolus administration of intravenous contrast. RADIATION DOSE REDUCTION: This exam was performed according to the departmental dose-optimization program  which includes automated exposure control, adjustment of the mA and/or kV according to patient size and/or use of iterative reconstruction technique. CONTRAST:  125m OMNIPAQUE IOHEXOL 350 MG/ML SOLN COMPARISON:  Aortic ultrasound 09/17/2018 FINDINGS: Lower chest: Breathing motion at the lung bases. No pleural effusion. Please see separate dictation of chest CT from same day. Hepatobiliary: No space-occupying liver lesion. Patent portal vein. Gallbladder is nondilated. Pancreas: Preserved pancreatic parenchyma without obvious mass. Spleen: Spleen is nonenlarged. Adrenals/Urinary Tract: Adrenal glands are preserved. No enhancing renal mass or collecting system filling defect. Distended urinary bladder. Small left lateral bladder diverticula. Stomach/Bowel: On this non oral contrast exam, the large bowel has a normal course and caliber with scattered stool. Slightly redundant course to the sigmoid colon into the right midabdomen. Normal retrocecal appendix extending  lateral. Stomach is mildly distended with some luminal air and debris. Small bowel is nondilated. Vascular/Lymphatic: Normal caliber IVC. There is diffuse atherosclerotic changes along the aorta and branch vessels. The inferior abdominal aorta has mild dilatation up to 3.3 x 3.2 cm. There is also dilatation of the common iliac vessels on the left measuring up to 19 mm and right 18 mm. 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 Stenosis of the right internal iliac as well. Few prominent but not pathologic nodes identified along the inguinal regions. No retroperitoneal lower abdominal lymph node enlargement identified this time. Small calcifications in the porta hepatis near the margin of the duodenal of uncertain etiology and significance. These were seen on old chest CT scan 2019 and are likely  benign. Reproductive: Prostate is unremarkable. There is thickened soft tissues along the course of the penis at the edge of the imaging field with a small amount of fluid and edema extending along the dorsal aspect of the base of the penis as seen on sagittal series 6, image 60. Please correlate with clinical findings. Prominent adjacent vasculature. Other: Small fat containing left inguinal hernia. Musculoskeletal: Diffuse degenerative changes of the spine and pelvis. There is multilevel stenosis along the lumbar spine. Please correlate with symptoms. Anasarca. IMPRESSION: Diffuse colonic stool. No bowel obstruction, free air or free fluid. 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 Recommend vascular surgery consultation when clinically appropriate. Skin thickening and soft tissue thickening of the shaft of the penis with adjacent stranding and edema. There is also several prominent nodes in the inguinal regions, more than usually seen. Please correlate with direct visualization. Please see separate dictation of chest CT. Electronically Signed   By: Jill Side M.D.   On: 11/28/2022 18:41   CT Angio Chest PE W and/or Wo Contrast  Result Date: 11/28/2022 CLINICAL DATA:  Shortness of breath for several days. EXAM: CT ANGIOGRAPHY CHEST WITH CONTRAST TECHNIQUE: Multidetector CT imaging of the chest was performed using the standard protocol during bolus administration of intravenous contrast. Multiplanar CT image reconstructions and MIPs were obtained to evaluate the vascular anatomy. RADIATION DOSE REDUCTION: This exam was performed according to the departmental dose-optimization program which includes automated exposure control, adjustment of the mA and/or kV according to patient size and/or use of iterative reconstruction technique. CONTRAST:   168m OMNIPAQUE IOHEXOL 350 MG/ML SOLN COMPARISON:  X-ray 11/28/2022 and older CT chest 09/17/2018. FINDINGS: Cardiovascular: The heart is nonenlarged. At most trace pericardial fluid. Normal caliber thoracic aorta with some scattered vascular calcifications. There is a bovine type aortic arch. Coronary artery calcifications are noted. No pulmonary embolism identified. There is significant breathing motion identified which can limit evaluation of small peripheral emboli. Mediastinum/Nodes: Small hiatal hernia with a mildly patulous esophagus. Small thyroid gland. There is no specific abnormal lymph node enlargement present in the axillary region. Some small bilateral axillary nodes are seen more prominent than usually seen but not pathologic by size criteria. No mediastinal or hilar lymph node enlargement. Lungs/Pleura: Significant breathing motion. No consolidation, pneumothorax or effusion. There is some centrilobular emphysematous lung changes identified. No pneumothorax or effusion. Minimal ground-glass peripherally in the right midlung on  image 58, upper lobe. Upper Abdomen: The adrenal glands are preserved. Please see separate dictation of abdomen and pelvis CT. Musculoskeletal: Slight curvature of the spine with some mild degenerative changes. Review of the MIP images confirms the above findings. IMPRESSION: Significant breathing motion.  No large or central embolus. Small area of ground-glass in the right upper lobe peripherally. Nonspecific. Recommend follow-up in 6 months. Small hiatal hernia with a patulous esophagus. Emphysematous lung changes. Please see separate dictation of abdomen and pelvis CT. Aortic Atherosclerosis (ICD10-I70.0) and Emphysema (ICD10-J43.9). Electronically Signed   By: Jill Side M.D.   On: 11/28/2022 18:33   DG Chest 2 View  Result Date: 11/28/2022 CLINICAL DATA:  Shortness of breath and wheezing. EXAM: CHEST - 2 VIEW COMPARISON:  Chest two views 05/07/2016, CT chest  09/17/2018 FINDINGS: Cardiac silhouette and mediastinal contours within normal limits. Moderate calcification again is again seen within the aortic arch. Flattening of the diaphragms and mild hyperinflation. No acute airspace opacity is seen. No pleural effusion or pneumothorax. Mild dextrocurvature of the mid to upper thoracic spine with mild multilevel degenerative disc changes. IMPRESSION: 1. No acute lung process. 2. Chronic mild hyperinflation of the lungs, which can be seen with chronic emphysematous changes. Electronically Signed   By: Yvonne Kendall M.D.   On: 11/28/2022 12:51    Procedures Procedures    Medications Ordered in ED Medications  albuterol (VENTOLIN HFA) 108 (90 Base) MCG/ACT inhaler 2 puff (has no administration in time range)  AeroChamber Plus Flo-Vu Medium MISC 1 each (has no administration in time range)  ipratropium-albuterol (DUONEB) 0.5-2.5 (3) MG/3ML nebulizer solution 3 mL (3 mLs Nebulization Given 11/28/22 1302)  methylPREDNISolone sodium succinate (SOLU-MEDROL) 125 mg/2 mL injection 125 mg (125 mg Intravenous Given 11/28/22 1258)  furosemide (LASIX) injection 20 mg (20 mg Intravenous Given 11/28/22 1723)  iohexol (OMNIPAQUE) 350 MG/ML injection 100 mL (100 mLs Intravenous Contrast Given 11/28/22 1811)    ED Course/ Medical Decision Making/ A&P                             Medical Decision Making Patient presenting with multiple complaints, shortness of breath, actively wheezing, improved after albuterol treatment.  Suspect this represents a bronchitis/emphysema flare.  He is a former smoker.  No pneumonia, no evidence for fluid overload on exam or by his BNP.  This does not reflect CHF.  He does have bilateral peripheral lower extremity edema, there is also some edema in his arms as well but he also has a significant excoriating rash nearly confluent on his arms and his chest which appears to be eczematous in character.  There was concern for possible cirrhosis  presentation as he does have a history of EtOH abuse.  However his LFTs are normal, CT imaging was completed with no obvious liver lesions or pattern suggesting cirrhosis.  He has good pedal pulses, no calf pain or tenderness, the edema is bilateral, DVT highly unlikely.  He was given a dose of Lasix here and he has started to diurese.  He will be sent home with an albuterol MDI, prednisone taper which will not only help his wheezing but should significantly improve his rash as well.  Lasix for daily use as needed.  He was advised he will need close follow-up with his primary MD, was asked to make an appointment tomorrow for recheck within a week.  Amount and/or Complexity of Data Reviewed Labs: ordered.    Details:  Normal hepatic function panel, BNP is normal at 83, he has a normal troponin, CBC is relatively normal a mild hemoglobin reduction at 12.2, this is a normocytic pattern.  His BUN is normal as well, normal electrolytes, creatinine at 1.0. Radiology: ordered.    Details: Plain film imaging negative for acute respiratory findings, there is a emphysematous pattern however.  Proceeded to CT angio to rule out PE, this was negative.  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  Several prominent lymph nodes in his inguinal region of unclear etiology.  Risk Prescription drug management. Decision regarding hospitalization.           Final Clinical Impression(s) / ED Diagnoses Final diagnoses:  Peripheral edema  Wheezing  Skin rash    Rx / DC Orders ED Discharge Orders          Ordered    furosemide (LASIX) 20 MG tablet  Daily PRN        11/28/22 1950              Landis Martins 11/28/22 2000    Cristie Hem, MD 11/29/22 1304

## 2022-11-28 NOTE — ED Notes (Addendum)
Pt. Walked approximately 30 ft. Pt. Walked extremely slowed, needed a walker, seemed winded and didn't really seem comfortable putting weight on his right foot. O2 sat was 94% on room air with a heart rate of 94 bpm Respirations 18.

## 2022-11-28 NOTE — Discharge Instructions (Addendum)
You have been given an inhaler to help you with your shortness of breath and wheezing, you may take 2 puffs of this every 4 hours if you are wheezing.  Your doctor had called you in a prescription for your rash, this medication is called prednisone and is waiting for you at your pharmacy, please get this medication picked up and start taking this as it should help you a lot with your skin rash.  I am also prescribing you a short course of a diuretic medication to help you with the swelling in your legs.  This should help continue to reduce the swelling.  Plan to see your primary doctor for recheck of your symptoms within the next week.  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  This is not an emergent finding but will need follow up with a vascular doctor.  Talk to Dr. Legrand Rams about this when you see him in follow up who can arrange this for you.

## 2022-12-12 ENCOUNTER — Other Ambulatory Visit (HOSPITAL_COMMUNITY)
Admission: RE | Admit: 2022-12-12 | Discharge: 2022-12-12 | Disposition: A | Discharge status: Home / Self Care | Payer: 59 | Source: Ambulatory Visit | Attending: Internal Medicine | Admitting: Internal Medicine

## 2022-12-12 DIAGNOSIS — I10 Essential (primary) hypertension: Secondary | ICD-10-CM | POA: Insufficient documentation

## 2022-12-12 LAB — BASIC METABOLIC PANEL
Anion gap: 8 (ref 5–15)
BUN: 29 mg/dL — ABNORMAL HIGH (ref 8–23)
CO2: 24 mmol/L (ref 22–32)
Calcium: 8.3 mg/dL — ABNORMAL LOW (ref 8.9–10.3)
Chloride: 104 mmol/L (ref 98–111)
Creatinine, Ser: 1.43 mg/dL — ABNORMAL HIGH (ref 0.61–1.24)
GFR, Estimated: 50 mL/min — ABNORMAL LOW (ref >60)
Glucose, Bld: 102 mg/dL — ABNORMAL HIGH (ref 70–99)
Potassium: 4.2 mmol/L (ref 3.5–5.1)
Sodium: 136 mmol/L (ref 135–145)

## 2022-12-13 ENCOUNTER — Encounter (HOSPITAL_COMMUNITY): Payer: Self-pay

## 2022-12-13 ENCOUNTER — Other Ambulatory Visit: Payer: Self-pay

## 2022-12-13 ENCOUNTER — Emergency Department (HOSPITAL_COMMUNITY): Payer: 59

## 2022-12-13 ENCOUNTER — Emergency Department (HOSPITAL_COMMUNITY)
Admission: EM | Admit: 2022-12-13 | Discharge: 2022-12-13 | Disposition: A | Discharge status: Home / Self Care | Payer: 59 | Attending: Emergency Medicine | Admitting: Emergency Medicine

## 2022-12-13 DIAGNOSIS — R600 Localized edema: Secondary | ICD-10-CM | POA: Diagnosis present

## 2022-12-13 DIAGNOSIS — R21 Rash and other nonspecific skin eruption: Secondary | ICD-10-CM | POA: Insufficient documentation

## 2022-12-13 DIAGNOSIS — R0602 Shortness of breath: Secondary | ICD-10-CM | POA: Diagnosis not present

## 2022-12-13 DIAGNOSIS — R609 Edema, unspecified: Secondary | ICD-10-CM

## 2022-12-13 DIAGNOSIS — L0390 Cellulitis, unspecified: Secondary | ICD-10-CM

## 2022-12-13 LAB — CBC WITH DIFFERENTIAL/PLATELET
Abs Immature Granulocytes: 0.06 10*3/uL (ref 0.00–0.07)
Basophils Absolute: 0 10*3/uL (ref 0.0–0.1)
Basophils Relative: 0 %
Eosinophils Absolute: 1.4 10*3/uL — ABNORMAL HIGH (ref 0.0–0.5)
Eosinophils Relative: 18 %
HCT: 37.6 % — ABNORMAL LOW (ref 39.0–52.0)
Hemoglobin: 12.1 g/dL — ABNORMAL LOW (ref 13.0–17.0)
Immature Granulocytes: 1 %
Lymphocytes Relative: 16 %
Lymphs Abs: 1.2 10*3/uL (ref 0.7–4.0)
MCH: 29.7 pg (ref 26.0–34.0)
MCHC: 32.2 g/dL (ref 30.0–36.0)
MCV: 92.2 fL (ref 80.0–100.0)
Monocytes Absolute: 0.8 10*3/uL (ref 0.1–1.0)
Monocytes Relative: 10 %
Neutro Abs: 4.3 10*3/uL (ref 1.7–7.7)
Neutrophils Relative %: 55 %
Platelets: 323 10*3/uL (ref 150–400)
RBC: 4.08 MIL/uL — ABNORMAL LOW (ref 4.22–5.81)
RDW: 13.7 % (ref 11.5–15.5)
WBC: 7.8 10*3/uL (ref 4.0–10.5)
nRBC: 0 % (ref 0.0–0.2)

## 2022-12-13 LAB — COMPREHENSIVE METABOLIC PANEL
ALT: 39 U/L (ref 0–44)
AST: 32 U/L (ref 15–41)
Albumin: 3.3 g/dL — ABNORMAL LOW (ref 3.5–5.0)
Alkaline Phosphatase: 75 U/L (ref 38–126)
Anion gap: 8 (ref 5–15)
BUN: 28 mg/dL — ABNORMAL HIGH (ref 8–23)
CO2: 25 mmol/L (ref 22–32)
Calcium: 8.2 mg/dL — ABNORMAL LOW (ref 8.9–10.3)
Chloride: 103 mmol/L (ref 98–111)
Creatinine, Ser: 1.27 mg/dL — ABNORMAL HIGH (ref 0.61–1.24)
GFR, Estimated: 58 mL/min — ABNORMAL LOW (ref >60)
Glucose, Bld: 103 mg/dL — ABNORMAL HIGH (ref 70–99)
Potassium: 4.4 mmol/L (ref 3.5–5.1)
Sodium: 136 mmol/L (ref 135–145)
Total Bilirubin: 0.6 mg/dL (ref 0.3–1.2)
Total Protein: 6.3 g/dL — ABNORMAL LOW (ref 6.5–8.1)

## 2022-12-13 LAB — BRAIN NATRIURETIC PEPTIDE: B Natriuretic Peptide: 49 pg/mL (ref 0.0–100.0)

## 2022-12-13 LAB — TROPONIN I (HIGH SENSITIVITY): Troponin I (High Sensitivity): 6 ng/L (ref <18)

## 2022-12-13 MED ORDER — DOXYCYCLINE HYCLATE 100 MG PO TABS
100.0000 mg | ORAL_TABLET | Freq: Once | ORAL | Status: AC
  Administered 2022-12-13: 100 mg via ORAL
  Filled 2022-12-13: qty 1

## 2022-12-13 MED ORDER — DOXYCYCLINE HYCLATE 100 MG PO CAPS: 100.0000 mg | ORAL_CAPSULE | Freq: Two times a day (BID) | ORAL | 0 refills | Status: DC

## 2022-12-13 MED ORDER — CEPHALEXIN 500 MG PO CAPS: 500.0000 mg | ORAL_CAPSULE | Freq: Two times a day (BID) | ORAL | 0 refills | Status: DC

## 2022-12-13 MED ORDER — FUROSEMIDE 20 MG PO TABS: 20.0000 mg | ORAL_TABLET | Freq: Every day | ORAL | 0 refills | Status: AC

## 2022-12-13 MED ORDER — FUROSEMIDE 10 MG/ML IJ SOLN
20.0000 mg | Freq: Once | INTRAMUSCULAR | Status: AC
  Administered 2022-12-13: 20 mg via INTRAVENOUS
  Filled 2022-12-13: qty 2

## 2022-12-13 NOTE — ED Triage Notes (Signed)
Pt reports he was seen here for leg swelling and shortness of breath on 1/17 and received prednisone and lasix and it has not improved.  Reports he just came from his PCP office and was told to come back to the ER to be treated for cellulitis.

## 2022-12-13 NOTE — Discharge Instructions (Addendum)
You will likely need to follow-up with a dermatologist.  Hopefully the fluid can help get some of the swelling off you.  Antibiotics may also help. Also follow-up with vascular surgery for potential venous issues.

## 2022-12-13 NOTE — ED Provider Notes (Signed)
[facility] Provider Note   CSN: [id number] Arrival date & time: 12/13/22  1147     History  Chief Complaint  Patient presents with   Leg Swelling    Kenneth [name] is a 77 y.o. male.  HPI 77 year old male presents for evaluation of possible cellulitis.  History is from patient and brother.  Patient is been dealing with progressive swelling in his legs and now his abdomen for a week and 1/2-2 weeks.  Some shortness of breath and chest pain on and off during that time as well.  Seems like his right thigh seems to be red and warm compared to the left and his PCP saw him today and told him he needed IV antibiotics.  He has not had a fever.  He has not had any particular joint pain besides some posterior knee pain that he attributes to a rash.  He had a chronic rash for 2+ months all over that is scaly.  Home Medications Prior to Admission medications   Medication Sig Start Date End Date Taking? Authorizing Provider  albuterol (VENTOLIN HFA) 108 (90 Base) MCG/ACT inhaler Inhale 2 puffs into the lungs every 6 (six) hours as needed for shortness of breath. 10/01/22  Yes [provider]  amLODipine (NORVASC) 5 MG tablet Take 5 mg by mouth daily. Patient not taking: Reported on 12/13/2022    [provider]  furosemide (LASIX) 20 MG tablet Take 1 tablet (20 mg total) by mouth daily as needed (lower extremity swelling). Patient not taking: Reported on 12/13/2022 11/28/22   Evalee Jefferson, PA-C  mineral oil-hydrophilic petrolatum (AQUAPHOR) ointment Apply topically as needed for dry skin. 11/28/22   Jeanell Sparrow, DO  predniSONE (DELTASONE) 10 MG tablet Take 10 mg by mouth daily. 6,5,4,3,2,1 taper dose Patient not taking: Reported on 11/28/2022 11/22/22   [provider]      Allergies    Patient has no known allergies.    Review of Systems   Review of Systems  Constitutional:  Negative for fever.  Respiratory:  Positive for  shortness of breath.   Cardiovascular:  Positive for chest pain and leg swelling.  Gastrointestinal:  Positive for abdominal distention.  Skin:  Positive for color change.    Physical Exam Updated Vital Signs BP (!) 121/90   Pulse 91   Temp 98.3 F (36.8 C) (Oral)   Resp 19   Ht '5\' 3"'$  (1.6 m)   Wt 65.3 kg   SpO2 100%   BMI 25.51 kg/m  Physical Exam Vitals and nursing note reviewed.  Constitutional:      Appearance: He is well-developed.  HENT:     Head: Normocephalic and atraumatic.  Cardiovascular:     Rate and Rhythm: Normal rate and regular rhythm.     Pulses:          Posterior tibial pulses are 2+ on the right side.     Heart sounds: Normal heart sounds.  Pulmonary:     Effort: Pulmonary effort is normal.     Breath sounds: Wheezing (mild, diffuse) present.  Abdominal:     General: There is distension.     Palpations: Abdomen is soft.     Tenderness: There is no abdominal tenderness.  Musculoskeletal:     Right lower leg: Edema present.     Left lower leg: Edema present.     Comments: Pitting edema to both lower extremities. This includes feet, lower legs and thighs. There is  also a diffuse scaly rash all over. There is perhaps some mild redness to right inner thigh with mild warmth. Also similar rash but a little less redness on left thigh.  Skin:    General: Skin is warm and dry.  Neurological:     Mental Status: He is alert.     ED Results / Procedures / Treatments   Labs (all labs ordered are listed, but only abnormal results are displayed) Labs Reviewed  COMPREHENSIVE METABOLIC PANEL - Abnormal; Notable for the following components:      Result Value   Glucose, Bld 103 (*)    BUN 28 (*)    Creatinine, Ser 1.27 (*)    Calcium 8.2 (*)    Total Protein 6.3 (*)    Albumin 3.3 (*)    GFR, Estimated 58 (*)    All other components within normal limits  CBC WITH DIFFERENTIAL/PLATELET - Abnormal; Notable for the following components:   RBC 4.08 (*)     Hemoglobin 12.1 (*)    HCT 37.6 (*)    Eosinophils Absolute 1.4 (*)    All other components within normal limits  BRAIN NATRIURETIC PEPTIDE  TROPONIN I (HIGH SENSITIVITY)    EKG EKG Interpretation  Date/Time:  Thursday December 13 2022 13:45:39 EST Ventricular Rate:  86 PR Interval:  175 QRS Duration: 91 QT Interval:  377 QTC Calculation: 451 R Axis:   30 Text Interpretation: Sinus rhythm Interpretation limited secondary to artifact otherwise similar to Jan 2024 Confirmed by Sherwood Gambler [phone]) on 12/13/2022 2:37:14 PM  Radiology DG Chest 2 View  Result Date: 12/13/2022 CLINICAL DATA:  Leg swelling and shortness of breath. EXAM: CHEST - 2 VIEW COMPARISON:  Chest radiograph and CTA chest 11/28/2022 FINDINGS: The cardiomediastinal silhouette is normal. There is no focal consolidation or pulmonary edema. There is no pleural effusion or pneumothorax There is no acute osseous abnormality. IMPRESSION: Stable chest with no radiographic evidence of acute cardiopulmonary process. Electronically Signed   By: Valetta Mole M.D.   On: 12/13/2022 13:36    Procedures Procedures    Medications Ordered in ED Medications  furosemide (LASIX) injection 20 mg (20 mg Intravenous Given 12/13/22 1519)  doxycycline (VIBRA-TABS) tablet 100 mg (100 mg Oral Given 12/13/22 1525)    ED Course/ Medical Decision Making/ A&P                             Medical Decision Making Amount and/or Complexity of Data Reviewed Labs: ordered.    Details: Normal WBC. Stable anemia. No AKI. BNP normal.  Radiology: ordered and independent interpretation performed.    Details: No pulmonary edema ECG/medicine tests: independent interpretation performed.    Details: No acute ischemia  Risk Prescription drug management.   Has multiple issues.  1 is the leg swelling right greater than left.  No evidence that this is CHF with a normal BNP and no pulmonary edema.  Discussed with Dr. Waldron Labs, advises DVT study and has  seen patient, if negative or no concerning findings can go home on antibiotics.  Otherwise will need an outpatient dermatology referral for the chronic rash she has been having.  Otherwise patient is hemodynamically stable.  I doubt sepsis.  He was sent in with concern for cellulitis of his leg which is possible versus stasis dermatitis but I think antibiotics would be reasonable but do not think he needs to be in the hospital for it. Care to Dr. Alvino Chapel  with U/S pending.        Final Clinical Impression(s) / ED Diagnoses Final diagnoses:  None    Rx / DC Orders ED Discharge Orders     None         Sherwood Gambler, MD 12/13/22 1550

## 2022-12-13 NOTE — Consult Note (Signed)
Patient Demographics  Kenneth [name], is a 77 y.o. male   MRN: [id number]   DOB - [birth year]  Admit Date - 12/13/2022    Outpatient Primary MD for the patient is Carrolyn Meiers, MD  Consult requested in the Hospital by Davonna Belling, MD, On 12/13/2022    Reason for consult : evaluate for admission   With History of -  Past Medical History:  Diagnosis Date   Hypertension       Past Surgical History:  Procedure Laterality Date   HERNIA REPAIR      in for   Chief Complaint  Patient presents with   Leg Swelling     HPI  Kenneth [name]  is a 77 y.o. male, sent by PCP for evaluation for possible cellulitis, patient came to ED with his brother, patient reports progressive lower extremity edema over the last few weeks, as well he does report rash has been progressive over last few weeks as well, and had some erythema at right thigh, so PCP sent to ED possible need for IV antibiotics, patient denies any fever, chills, any dyspnea, he denies orthopnea, cough, or productive sputum, no chest pain as well. -In ED workup significant for normal BNP at 49, creatinine at 2.27, patient had no leukocytosis, chest x-ray with no acute cardiopulmonary process, ED physician consulted to get hospitalist for evaluation regarding edema, and rash, and possible need for admission.    Review of Systems     A full 10 point Review of Systems was done, except as stated above, all other Review of Systems were negative.   Social History Social History   Tobacco Use   Smoking status: Former    Types: Cigarettes   Smokeless tobacco: Never  Substance Use Topics   Alcohol use: Yes    Comment: a couple times a week     Family History History reviewed. No pertinent family history.   Prior to Admission medications   Medication Sig Start Date End Date Taking? Authorizing Provider  albuterol  (VENTOLIN HFA) 108 (90 Base) MCG/ACT inhaler Inhale 2 puffs into the lungs every 6 (six) hours as needed for shortness of breath. 10/01/22  Yes [provider]  amLODipine (NORVASC) 5 MG tablet Take 5 mg by mouth daily. Patient not taking: Reported on 12/13/2022    [provider]  furosemide (LASIX) 20 MG tablet Take 1 tablet (20 mg total) by mouth daily as needed (lower extremity swelling). Patient not taking: Reported on 12/13/2022 11/28/22   Evalee Jefferson, PA-C  mineral oil-hydrophilic petrolatum (AQUAPHOR) ointment Apply topically as needed for dry skin. 11/28/22   Jeanell Sparrow, DO  predniSONE (DELTASONE) 10 MG tablet Take 10 mg by mouth daily. 6,5,4,3,2,1 taper dose Patient not taking: Reported on 11/28/2022 11/22/22   [provider]    Anti-infectives (From admission, onward)    Start     Dose/Rate Route Frequency Ordered Stop   12/13/22 1530  doxycycline (VIBRA-TABS) tablet 100 mg  100 mg Oral  Once 12/13/22 1517 12/13/22 1525       Scheduled Meds: Continuous Infusions: PRN Meds:.  No Known Allergies  Physical Exam  Vitals  Blood pressure (!) 131/107, pulse 96, temperature 98.3 F (36.8 C), temperature source Oral, resp. rate 16, height '5\' 3"'$  (1.6 m), weight 65.3 kg, SpO2 97 %.   1. General elderly male , lying in bed in NAD,   2. Normal affect and insight, Not Suicidal or Homicidal, Awake Alert, Oriented X 3.  3. No F.N deficits, ALL C.Nerves Intact, Strength 5/5 all 4 extremities, Sensation intact all 4 extremities, Plantars down going.  4. Ears and Eyes appear Normal, Conjunctivae clear, PERRLA. Moist Oral Mucosa.  5. Supple Neck, No JVD, No cervical lymphadenopathy appriciated, No Carotid Bruits.  6. Symmetrical Chest wall movement, Good air movement bilaterally, scattered  wheezing  7. RRR, No Gallops, Rubs or Murmurs, No Parasternal Heave.  Millikin lower extremity edema up to thigh area  8. Positive Bowel Sounds, Abdomen Soft, No  tenderness, No organomegaly appriciated,No rebound -guarding or rigidity.  9.  No Cyanosis, Normal Skin Turgor, Lower extremity edema, upper chest area, upper extremities and shoulder  10. Good muscle tone,  joints appear normal , with diffuse scaly rash   CBC Recent Labs  Lab 12/13/22 1310  WBC 7.8  HGB 12.1*  HCT 37.6*  PLT 323  MCV 92.2  MCH 29.7  MCHC 32.2  RDW 13.7  LYMPHSABS 1.2  MONOABS 0.8  EOSABS 1.4*  BASOSABS 0.0   ------------------------------------------------------------------------------------------------------------------  Chemistries  Recent Labs  Lab 12/12/22 1230 12/13/22 1310  NA 136 136  K 4.2 4.4  CL 104 103  CO2 24 25  GLUCOSE 102* 103*  BUN 29* 28*  CREATININE 1.43* 1.27*  CALCIUM 8.3* 8.2*  AST  --  32  ALT  --  39  ALKPHOS  --  75  BILITOT  --  0.6   ------------------------------------------------------------------------------------------------------------------ estimated creatinine clearance is 39.2 mL/min (A) (by C-G formula based on SCr of 1.27 mg/dL (H)). ------------------------------------------------------------------------------------------------------------------ No results for input(s): "TSH", "T4TOTAL", "T3FREE", "THYROIDAB" in the last 72 hours.  Invalid input(s): "FREET3"   Coagulation profile No results for input(s): "INR", "PROTIME" in the last 168 hours. ------------------------------------------------------------------------------------------------------------------- No results for input(s): "DDIMER" in the last 72 hours. -------------------------------------------------------------------------------------------------------------------  Cardiac Enzymes No results for input(s): "CKMB", "TROPONINI", "MYOGLOBIN" in the last 168 hours.  Invalid input(s): "CK" ------------------------------------------------------------------------------------------------------------------ Invalid input(s):  "POCBNP"   ---------------------------------------------------------------------------------------------------------------  Urinalysis No results found for: "COLORURINE", "APPEARANCEUR", "LABSPEC", "PHURINE", "GLUCOSEU", "HGBUR", "BILIRUBINUR", "KETONESUR", "PROTEINUR", "UROBILINOGEN", "NITRITE", "LEUKOCYTESUR"   Imaging results:   US Venous Img Lower Bilateral (DVT)  Result Date: 12/13/2022 CLINICAL DATA:  Edema. EXAM: BILATERAL LOWER EXTREMITY VENOUS DOPPLER ULTRASOUND TECHNIQUE: Gray-scale sonography with compression, as well as color and duplex ultrasound, were performed to evaluate the deep venous system(s) from the level of the common femoral vein through the popliteal and proximal calf veins. COMPARISON:  None Available. FINDINGS: VENOUS Normal compressibility of the common femoral, superficial femoral, and popliteal veins, as well as the visualized calf veins. Visualized portions of profunda femoral vein and great saphenous vein unremarkable. No filling defects to suggest DVT on grayscale or color Doppler imaging. Doppler waveforms show normal direction of venous flow, normal respiratory plasticity and response to augmentation. OTHER Extensive subcutaneous edema in both lower extremities. Limitations: none IMPRESSION: 1. No evidence of lower extremity DVT. 2. Extensive subcutaneous edema in both lower extremities. Electronically Signed   By: Lyndal Rainbow.D.  On: 12/13/2022 16:19   DG Chest 2 View  Result Date: 12/13/2022 CLINICAL DATA:  Leg swelling and shortness of breath. EXAM: CHEST - 2 VIEW COMPARISON:  Chest radiograph and CTA chest 11/28/2022 FINDINGS: The cardiomediastinal silhouette is normal. There is no focal consolidation or pulmonary edema. There is no pleural effusion or pneumothorax There is no acute osseous abnormality. IMPRESSION: Stable chest with no radiographic evidence of acute cardiopulmonary process. Electronically Signed   By: Valetta Mole M.D.   On: 12/13/2022  13:36       Assessment & Plan  Active Problems:   Lower extremity edema   Skin rash   Bilateral lower extremity edema -Patient presents with progressive bilateral lower extremity edema over the last few weeks. -No evidence of acute DVT on imaging. -Clinical evidence of acute CHF, no JVD, no crackles, no evidence of volume overload on chest imaging, BNP within normal limit. 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  Skin rash -Patient appears to be with diffuse scaly rash all over, mainly in the upper chest area, bilateral lower extremity, and shoulders area. -Does not appear to have fever, no leukocytosis, there is some slight erythema and warmth in the right inner thigh area of the rash, may be early infection, no indication for IV antibiotics at this point, have discussed with ED physician, patient can be discharged on doxycycline and Keflex x 7 days. -Discussed with brother at bedside, main management regarding this rash is patient will need to follow-up with dermatology which we do not have available here or any of [facility] hospitals, as likely he will need a biopsy for this rash regarding further management and recommendations, patient reports his primary care physician is working on finding him a dermatologist.   Patient can be discharged home today from , with recommendation for close follow-up with both vascular surgery and dermatology  Family Communication: Plan discussed with patient and brother at bedside   Thank you for the consult, we will follow the patient with you in the Hospital.   Phillips Climes M.D on 12/13/2022 at 4:59 PM     Thank you for the consult, we will follow the patient with you in the [facility]

## 2022-12-13 NOTE — ED Provider Notes (Signed)
  Physical Exam  BP (!) 150/91   Pulse 92   Temp 98.3 F (36.8 C) (Oral)   Resp 16   Ht '5\' 3"'$  (1.6 m)   Wt 65.3 kg   SpO2 99%   BMI 25.51 kg/m   Physical Exam  Procedures  Procedures  ED Course / MDM    Medical Decision Making Amount and/or Complexity of Data Reviewed Labs: ordered. Radiology: ordered.  Risk Prescription drug management.   Received in signout pending Doppler.  Doppler negative.  Rash and edema.  Will give antibiotics for potential cellulitis.  Will need follow-up with potentially dermatology and vascular.  Seen by hospitalist do not think patient needs admission to the hospital.  Recommended Doxy and Keflex.  Will discharge home.       Davonna Belling, MD 12/13/22 1719

## 2023-04-22 ENCOUNTER — Other Ambulatory Visit: Payer: Self-pay | Admitting: *Deleted

## 2023-04-22 DIAGNOSIS — M79605 Pain in left leg: Secondary | ICD-10-CM

## 2023-04-25 ENCOUNTER — Ambulatory Visit (HOSPITAL_COMMUNITY)
Admission: RE | Admit: 2023-04-25 | Discharge: 2023-04-25 | Disposition: A | Discharge status: Home / Self Care | Payer: 59 | Source: Ambulatory Visit | Attending: Vascular Surgery | Admitting: Vascular Surgery

## 2023-04-25 DIAGNOSIS — M79604 Pain in right leg: Secondary | ICD-10-CM | POA: Insufficient documentation

## 2023-04-25 DIAGNOSIS — M79605 Pain in left leg: Secondary | ICD-10-CM | POA: Diagnosis not present

## 2023-05-07 ENCOUNTER — Inpatient Hospital Stay (HOSPITAL_COMMUNITY)
Admission: EM | Admit: 2023-05-07 | Discharge: 2023-05-15 | DRG: 471 | Disposition: A | Discharge status: Skilled Nursing Facility | Payer: 59 | Source: Ambulatory Visit | Attending: Internal Medicine | Admitting: Internal Medicine

## 2023-05-07 ENCOUNTER — Encounter (HOSPITAL_COMMUNITY): Payer: Self-pay | Admitting: Emergency Medicine

## 2023-05-07 ENCOUNTER — Emergency Department (HOSPITAL_COMMUNITY): Payer: 59

## 2023-05-07 ENCOUNTER — Observation Stay (HOSPITAL_COMMUNITY): Payer: 59

## 2023-05-07 ENCOUNTER — Other Ambulatory Visit: Payer: Self-pay

## 2023-05-07 ENCOUNTER — Observation Stay (HOSPITAL_BASED_OUTPATIENT_CLINIC_OR_DEPARTMENT_OTHER): Payer: 59

## 2023-05-07 DIAGNOSIS — Z7952 Long term (current) use of systemic steroids: Secondary | ICD-10-CM

## 2023-05-07 DIAGNOSIS — Z22322 Carrier or suspected carrier of Methicillin resistant Staphylococcus aureus: Secondary | ICD-10-CM

## 2023-05-07 DIAGNOSIS — K08109 Complete loss of teeth, unspecified cause, unspecified class: Secondary | ICD-10-CM | POA: Diagnosis present

## 2023-05-07 DIAGNOSIS — R627 Adult failure to thrive: Secondary | ICD-10-CM | POA: Diagnosis present

## 2023-05-07 DIAGNOSIS — Z7982 Long term (current) use of aspirin: Secondary | ICD-10-CM

## 2023-05-07 DIAGNOSIS — Z79899 Other long term (current) drug therapy: Secondary | ICD-10-CM

## 2023-05-07 DIAGNOSIS — Z602 Problems related to living alone: Secondary | ICD-10-CM

## 2023-05-07 DIAGNOSIS — Z8673 Personal history of transient ischemic attack (TIA), and cerebral infarction without residual deficits: Secondary | ICD-10-CM

## 2023-05-07 DIAGNOSIS — M5001 Cervical disc disorder with myelopathy,  high cervical region: Secondary | ICD-10-CM | POA: Diagnosis not present

## 2023-05-07 DIAGNOSIS — R531 Weakness: Secondary | ICD-10-CM

## 2023-05-07 DIAGNOSIS — R29810 Facial weakness: Secondary | ICD-10-CM | POA: Diagnosis not present

## 2023-05-07 DIAGNOSIS — I10 Essential (primary) hypertension: Secondary | ICD-10-CM | POA: Diagnosis present

## 2023-05-07 DIAGNOSIS — E782 Mixed hyperlipidemia: Secondary | ICD-10-CM | POA: Insufficient documentation

## 2023-05-07 DIAGNOSIS — I6389 Other cerebral infarction: Secondary | ICD-10-CM

## 2023-05-07 DIAGNOSIS — G8194 Hemiplegia, unspecified affecting left nondominant side: Secondary | ICD-10-CM | POA: Diagnosis present

## 2023-05-07 DIAGNOSIS — R270 Ataxia, unspecified: Secondary | ICD-10-CM | POA: Diagnosis present

## 2023-05-07 DIAGNOSIS — Z87891 Personal history of nicotine dependence: Secondary | ICD-10-CM

## 2023-05-07 DIAGNOSIS — R29703 NIHSS score 3: Secondary | ICD-10-CM | POA: Diagnosis present

## 2023-05-07 DIAGNOSIS — L409 Psoriasis, unspecified: Secondary | ICD-10-CM | POA: Diagnosis present

## 2023-05-07 DIAGNOSIS — R2681 Unsteadiness on feet: Secondary | ICD-10-CM | POA: Diagnosis present

## 2023-05-07 DIAGNOSIS — Z87898 Personal history of other specified conditions: Secondary | ICD-10-CM

## 2023-05-07 DIAGNOSIS — R5381 Other malaise: Secondary | ICD-10-CM | POA: Diagnosis present

## 2023-05-07 DIAGNOSIS — R64 Cachexia: Secondary | ICD-10-CM | POA: Diagnosis present

## 2023-05-07 DIAGNOSIS — M4802 Spinal stenosis, cervical region: Secondary | ICD-10-CM | POA: Diagnosis present

## 2023-05-07 DIAGNOSIS — J441 Chronic obstructive pulmonary disease with (acute) exacerbation: Secondary | ICD-10-CM | POA: Diagnosis present

## 2023-05-07 DIAGNOSIS — I639 Cerebral infarction, unspecified: Principal | ICD-10-CM | POA: Diagnosis present

## 2023-05-07 DIAGNOSIS — Z6824 Body mass index (BMI) 24.0-24.9, adult: Secondary | ICD-10-CM

## 2023-05-07 DIAGNOSIS — R296 Repeated falls: Secondary | ICD-10-CM | POA: Diagnosis present

## 2023-05-07 DIAGNOSIS — I63542 Cerebral infarction due to unspecified occlusion or stenosis of left cerebellar artery: Secondary | ICD-10-CM | POA: Diagnosis present

## 2023-05-07 DIAGNOSIS — M2578 Osteophyte, vertebrae: Secondary | ICD-10-CM | POA: Diagnosis present

## 2023-05-07 DIAGNOSIS — L97519 Non-pressure chronic ulcer of other part of right foot with unspecified severity: Secondary | ICD-10-CM | POA: Diagnosis present

## 2023-05-07 DIAGNOSIS — R600 Localized edema: Secondary | ICD-10-CM | POA: Diagnosis present

## 2023-05-07 HISTORY — DX: Psoriasis, unspecified: L40.9

## 2023-05-07 LAB — CBC
HCT: 41.3 % (ref 39.0–52.0)
Hemoglobin: 12.9 g/dL — ABNORMAL LOW (ref 13.0–17.0)
MCH: 28.3 pg (ref 26.0–34.0)
MCHC: 31.2 g/dL (ref 30.0–36.0)
MCV: 90.6 fL (ref 80.0–100.0)
Platelets: 289 10*3/uL (ref 150–400)
RBC: 4.56 MIL/uL (ref 4.22–5.81)
RDW: 12.7 % (ref 11.5–15.5)
WBC: 5.9 10*3/uL (ref 4.0–10.5)
nRBC: 0 % (ref 0.0–0.2)

## 2023-05-07 LAB — DIFFERENTIAL
Abs Immature Granulocytes: 0.02 10*3/uL (ref 0.00–0.07)
Basophils Absolute: 0 10*3/uL (ref 0.0–0.1)
Basophils Relative: 0 %
Eosinophils Absolute: 1.3 10*3/uL — ABNORMAL HIGH (ref 0.0–0.5)
Eosinophils Relative: 22 %
Immature Granulocytes: 0 %
Lymphocytes Relative: 18 %
Lymphs Abs: 1.1 10*3/uL (ref 0.7–4.0)
Monocytes Absolute: 0.3 10*3/uL (ref 0.1–1.0)
Monocytes Relative: 6 %
Neutro Abs: 3.2 10*3/uL (ref 1.7–7.7)
Neutrophils Relative %: 54 %

## 2023-05-07 LAB — POCT I-STAT, CHEM 8
BUN: 15 mg/dL (ref 8–23)
Calcium, Ion: 1.23 mmol/L (ref 1.15–1.40)
Chloride: 107 mmol/L (ref 98–111)
Creatinine, Ser: 1.1 mg/dL (ref 0.61–1.24)
Glucose, Bld: 111 mg/dL — ABNORMAL HIGH (ref 70–99)
HCT: 42 % (ref 39.0–52.0)
Hemoglobin: 14.3 g/dL (ref 13.0–17.0)
Potassium: 4.2 mmol/L (ref 3.5–5.1)
Sodium: 141 mmol/L (ref 135–145)
TCO2: 26 mmol/L (ref 22–32)

## 2023-05-07 LAB — APTT: aPTT: 29 seconds (ref 24–36)

## 2023-05-07 LAB — LIPID PANEL
Cholesterol: 129 mg/dL (ref 0–200)
HDL: 47 mg/dL (ref >40)
LDL Cholesterol: 64 mg/dL (ref 0–99)
Total CHOL/HDL Ratio: 2.7 RATIO
Triglycerides: 92 mg/dL (ref <150)
VLDL: 18 mg/dL (ref 0–40)

## 2023-05-07 LAB — COMPREHENSIVE METABOLIC PANEL
ALT: 16 U/L (ref 0–44)
AST: 25 U/L (ref 15–41)
Albumin: 3.6 g/dL (ref 3.5–5.0)
Alkaline Phosphatase: 58 U/L (ref 38–126)
Anion gap: 7 (ref 5–15)
BUN: 16 mg/dL (ref 8–23)
CO2: 26 mmol/L (ref 22–32)
Calcium: 8.9 mg/dL (ref 8.9–10.3)
Chloride: 104 mmol/L (ref 98–111)
Creatinine, Ser: 1.07 mg/dL (ref 0.61–1.24)
GFR, Estimated: >60 mL/min (ref >60)
Glucose, Bld: 113 mg/dL — ABNORMAL HIGH (ref 70–99)
Potassium: 4 mmol/L (ref 3.5–5.1)
Sodium: 137 mmol/L (ref 135–145)
Total Bilirubin: 1 mg/dL (ref 0.3–1.2)
Total Protein: 7.3 g/dL (ref 6.5–8.1)

## 2023-05-07 LAB — ECHOCARDIOGRAM COMPLETE
Area-P 1/2: 3.42 cm2
Height: 64 in
S' Lateral: 3.2 cm
Weight: 2240 oz

## 2023-05-07 LAB — TSH: TSH: 0.939 u[IU]/mL (ref 0.350–4.500)

## 2023-05-07 LAB — PROTIME-INR
INR: 1.1 (ref 0.8–1.2)
Prothrombin Time: 14.2 seconds (ref 11.4–15.2)

## 2023-05-07 LAB — ETHANOL: Alcohol, Ethyl (B): <10 mg/dL (ref <10)

## 2023-05-07 LAB — VITAMIN D 25 HYDROXY (VIT D DEFICIENCY, FRACTURES): Vit D, 25-Hydroxy: 16.96 ng/mL — ABNORMAL LOW (ref 30–100)

## 2023-05-07 MED ORDER — ACETAMINOPHEN 325 MG PO TABS
650.0000 mg | ORAL_TABLET | ORAL | Status: DC | PRN
  Administered 2023-05-14: 650 mg via ORAL
  Filled 2023-05-07: qty 2

## 2023-05-07 MED ORDER — ACETAMINOPHEN 650 MG RE SUPP: 650.0000 mg | RECTAL | Status: DC | PRN

## 2023-05-07 MED ORDER — ENOXAPARIN SODIUM 40 MG/0.4ML IJ SOSY
40.0000 mg | PREFILLED_SYRINGE | INTRAMUSCULAR | Status: DC
  Administered 2023-05-07 – 2023-05-09 (×3): 40 mg via SUBCUTANEOUS
  Filled 2023-05-07 (×3): qty 0.4

## 2023-05-07 MED ORDER — SODIUM CHLORIDE 0.9% FLUSH
3.0000 mL | Freq: Once | INTRAVENOUS | Status: AC
  Administered 2023-05-07: 3 mL via INTRAVENOUS

## 2023-05-07 MED ORDER — SODIUM CHLORIDE 0.9 % IV SOLN: INTRAVENOUS | Status: DC

## 2023-05-07 MED ORDER — FAMOTIDINE 20 MG PO TABS
20.0000 mg | ORAL_TABLET | Freq: Every day | ORAL | Status: DC
  Administered 2023-05-08 – 2023-05-15 (×8): 20 mg via ORAL
  Filled 2023-05-07 (×8): qty 1

## 2023-05-07 MED ORDER — ASPIRIN 81 MG PO TBEC
81.0000 mg | DELAYED_RELEASE_TABLET | Freq: Every day | ORAL | Status: DC
  Administered 2023-05-07 – 2023-05-15 (×9): 81 mg via ORAL
  Filled 2023-05-07 (×10): qty 1

## 2023-05-07 MED ORDER — STROKE: EARLY STAGES OF RECOVERY BOOK
Freq: Once | Status: AC
  Filled 2023-05-07: qty 1

## 2023-05-07 MED ORDER — ACETAMINOPHEN 160 MG/5ML PO SOLN: 650.0000 mg | ORAL | Status: DC | PRN

## 2023-05-07 MED ORDER — SENNOSIDES-DOCUSATE SODIUM 8.6-50 MG PO TABS
1.0000 | ORAL_TABLET | Freq: Every evening | ORAL | Status: DC | PRN
  Administered 2023-05-13: 1 via ORAL
  Filled 2023-05-07: qty 1

## 2023-05-07 NOTE — ED Triage Notes (Addendum)
Pt sent by PCP due to left-sided weakness, difficulty ambulating, altered balance, visual changes. Pt denies dizziness and headache. A/O x 4 with some slurred speech noted in triage consistent with baseline per family at bedside. Pt has left arm drift and paresthesia to left hand. NIHSS 3

## 2023-05-07 NOTE — ED Provider Notes (Signed)
[place] [facility] AT [facility] Provider Note   CSN: [id number] Arrival date & time: 05/07/23  1107     History  Chief Complaint  Patient presents with   Weakness    Kenneth [name] is a 77 y.o. male.   Weakness  This patient is a 77 year old male, he has a history of hypertension on amlodipine, he also has a history of using a walker to get around, he lives by himself, he is accompanied by his son here today as he was transferred from the family doctor's office where he showed up with left arm weakness which has been going on for about 2 weeks and maybe longer.  The exact timeframe is not clear however he had been seen by his doctor in the vascular center a couple of weeks ago after being worked up for lower extremity edema.  He is not sure of the results of that workup.  He denies headache or changes in vision or speech or ability to eat, he has had difficulty walking with his walker because he is dragging his foot and having hard time using his left arm    Home Medications Prior to Admission medications   Medication Sig Start Date End Date Taking? Authorizing Provider  albuterol (VENTOLIN HFA) 108 (90 Base) MCG/ACT inhaler Inhale 2 puffs into the lungs every 6 (six) hours as needed for shortness of breath. 10/01/22   [provider]  amLODipine (NORVASC) 5 MG tablet Take 5 mg by mouth daily. Patient not taking: Reported on 12/13/2022    [provider]  cephALEXin (KEFLEX) 500 MG capsule Take 1 capsule (500 mg total) by mouth 2 (two) times daily. 12/13/22   Benjiman Core, MD  doxycycline (VIBRAMYCIN) 100 MG capsule Take 1 capsule (100 mg total) by mouth 2 (two) times daily. 12/13/22   Benjiman Core, MD  furosemide (LASIX) 20 MG tablet Take 1 tablet (20 mg total) by mouth daily. 12/13/22   Benjiman Core, MD  mineral oil-hydrophilic petrolatum (AQUAPHOR) ointment Apply topically as needed for dry skin. 11/28/22   Sloan Leiter, DO   predniSONE (DELTASONE) 10 MG tablet Take 10 mg by mouth daily. 6,5,4,3,2,1 taper dose Patient not taking: Reported on 11/28/2022 11/22/22   [provider]      Allergies    Patient has no known allergies.    Review of Systems   Review of Systems  Neurological:  Positive for weakness.  All other systems reviewed and are negative.   Physical Exam Updated Vital Signs BP 110/76 (BP Location: Right Arm)   Pulse (!) 57   Temp 97.8 F (36.6 C) (Oral)   Resp 18   Ht 1.626 m (5\' 4" )   Wt 63.5 kg   SpO2 97%   BMI 24.03 kg/m  Physical Exam Vitals and nursing note reviewed.  Constitutional:      General: He is not in acute distress.    Appearance: He is well-developed.  HENT:     Head: Normocephalic and atraumatic.     Mouth/Throat:     Pharynx: No oropharyngeal exudate.  Eyes:     General: No scleral icterus.       Right eye: No discharge.        Left eye: No discharge.     Conjunctiva/sclera: Conjunctivae normal.     Pupils: Pupils are equal, round, and reactive to light.  Neck:     Thyroid: No thyromegaly.     Vascular: No JVD.  Cardiovascular:  Rate and Rhythm: Normal rate and regular rhythm.     Heart sounds: Normal heart sounds. No murmur heard.    No friction rub. No gallop.  Pulmonary:     Effort: Pulmonary effort is normal. No respiratory distress.     Breath sounds: Normal breath sounds. No wheezing or rales.  Abdominal:     General: Bowel sounds are normal. There is no distension.     Palpations: Abdomen is soft. There is no mass.     Tenderness: There is no abdominal tenderness.  Musculoskeletal:        General: No tenderness. Normal range of motion.     Cervical back: Normal range of motion and neck supple.     Right lower leg: Edema present.     Left lower leg: Edema present.  Lymphadenopathy:     Cervical: No cervical adenopathy.  Skin:    General: Skin is warm and dry.     Findings: No erythema or rash.  Neurological:     Mental  Status: He is alert.     Coordination: Coordination normal.     Comments: Slight left-sided facial droop, speech is normal, gross visual acuity is normal, peripheral visual fields are normal, left leg appears to have normal strength, left upper extremity with significant weakness, grip seem to be equal, cannot perform finger-nose-finger secondary to left upper extremity weakness slight sensory deficit on the left arm as well  Psychiatric:        Behavior: Behavior normal.     ED Results / Procedures / Treatments   Labs (all labs ordered are listed, but only abnormal results are displayed) Labs Reviewed  CBC - Abnormal; Notable for the following components:      Result Value   Hemoglobin 12.9 (*)    All other components within normal limits  COMPREHENSIVE METABOLIC PANEL - Abnormal; Notable for the following components:   Glucose, Bld 113 (*)    All other components within normal limits  DIFFERENTIAL - Abnormal; Notable for the following components:   Eosinophils Absolute 1.3 (*)    All other components within normal limits  PROTIME-INR  APTT  ETHANOL  RAPID URINE DRUG SCREEN, HOSP PERFORMED  URINALYSIS, ROUTINE W REFLEX MICROSCOPIC  CBG MONITORING, ED  I-STAT CHEM 8, ED    EKG None  Radiology CT HEAD WO CONTRAST  Result Date: 05/07/2023 CLINICAL DATA:  Provided history: Neuro deficit, acute, stroke suspected. Left-sided weakness. Difficulty ambulating. Altered speech. Vision changes. Dizziness. Headache. EXAM: CT HEAD WITHOUT CONTRAST TECHNIQUE: Contiguous axial images were obtained from the base of the skull through the vertex without intravenous contrast. RADIATION DOSE REDUCTION: This exam was performed according to the departmental dose-optimization program which includes automated exposure control, adjustment of the mA and/or kV according to patient size and/or use of iterative reconstruction technique. COMPARISON:  Head CT 07/30/2010. FINDINGS: Brain: Redemonstrated foci of  chronic encephalomalacia/gliosis within the anteromedial frontal lobes bilaterally, mid right frontal lobe and left frontal operculum/left insula consistent with chronic infarcts. Background patchy and ill-defined hypoattenuation within the cerebral white matter, nonspecific but compatible with moderate chronic small vessel ischemic disease. There is no acute intracranial hemorrhage. No acute demarcated cortical infarct. No extra-axial fluid collection. No evidence of an intracranial mass. No midline shift. Vascular: No hyperdense vessel.  Atherosclerotic calcifications. Skull: No fracture or aggressive osseous lesion. Sinuses/Orbits: No mass or acute finding within the imaged orbits. Trace mucosal thickening within the right maxillary sinus at the imaged levels (with associated chronic reactive  osteitis). Small volume secretions within the right sphenoid sinus. IMPRESSION: 1.  No evidence of an acute intracranial abnormality. 2. 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 3. Background moderate chronic small vessel ischemic changes within the cerebral white matter. 4. Mild paranasal sinus disease at the imaged levels. Electronically Signed   By: Jackey Loge D.O.   On: 05/07/2023 13:01    Procedures Procedures    Medications Ordered in ED Medications  sodium chloride flush (NS) 0.9 % injection 3 mL (has no administration in time range)    ED Course/ Medical Decision Making/ A&P                             Medical Decision Making Amount and/or Complexity of Data Reviewed Labs: ordered. Radiology: ordered.  Risk Decision regarding hospitalization.    This patient presents to the ED for concern of weakness of the left leg and left arm, this involves an extensive number of treatment options, and is a complaint that carries with it a high risk of complications and morbidity.  The  differential diagnosis includes stroke, demyelinating disease, aneurysm, hemorrhage, infection   Co morbidities that complicate the patient evaluation  Elderly, hypertension   Additional history obtained:  Additional history obtained from medical record External records from outside source obtained and reviewed including ultrasound of the legs to rule out clot performed April 25, 2023, no evidence of DVT   Lab Tests:  I Ordered, and personally interpreted labs.  The pertinent results include:  CBC and CMP   Imaging Studies ordered:  I ordered imaging studies including CT head  I independently visualized and interpreted imaging which showed no acute hemorrhage or obvious new infarct I agree with the radiologist interpretation   Cardiac Monitoring: / EKG:  The patient was maintained on a cardiac monitor.  I personally viewed and interpreted the cardiac monitored which showed an underlying rhythm of: normal sinus rhythm   Consultations Obtained:  I requested consultation with the neurologist with Dr. Wilford Corner,  and discussed lab and imaging findings as well as pertinent plan - they recommend: admission and stay here   Problem List / ED Course / Critical interventions / Medication management  I have reviewed the patients home medicines and have made adjustments as needed   Social Determinants of Health:  I discussed the case with Dr. Laural Benes who will admit the patient to the hospital   Test / Admission - Considered:  Will admit         Final Clinical Impression(s) / ED Diagnoses Final diagnoses:  Acute ischemic stroke [facility])    Rx / DC Orders ED Discharge Orders     None         Eber Hong, MD 05/07/23 [phone]

## 2023-05-07 NOTE — Hospital Course (Addendum)
Brief hospital course: 77 year old gentleman with past history of hypertension, psoriasis, former longtime smoker who quit several months ago, former heavy alcohol drinker, quit months ago, who lives alone and is followed by Dr. Felecia Shelling for primary care.  Patient was discovered by brother on the day of admission with difficulty walking and a facial droop and weakness on the left upper extremity.  He was sent to see his primary care physician and subsequently sent to the [facility] emergency department for further evaluation and concern for stroke.   Neurology was not consulted.  MRI shows evidence of left cerebellar stroke.  Which will explain his ataxia but would not explain his left upper extremity weakness.  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  Neurosurgery was consulted.  Per neurosurgery patient will require decompressive surgery sometime this week.  Assessment and Plan: Cervical myelopathy. Progressively worsening. Patient will require operative decompression. Dr. Franky Macho from neurosurgery following.  Surgery scheduled on 6/28. Appreciate assistance. Patient did receive 1 dose of Plavix on 6/26 in the morning. Currently on Decadron. Monitor postop course.  Acute left cerebellar stroke. Discussed with neurology on 6/27. Seen by neurology on 6/26. Etiology most likely small vessel disease.  Recommend aspirin and Plavix for 3 weeks once cleared by neurosurgery followed by aspirin alone. Cleared by speech for swallowing. PT OT recommending CIR.  Right leg ulcer. Prior history of left leg ulcer. Patient has history of ulcer on his left dorsum foot. Now present to the hospital with complaints of right dorsal foot ulceration. Does not appear to be acutely infected. ABI negative.  X-ray foot in the past unremarkable as well. Appreciate wound care assistance.  Essential hypertension. Currently holding home medication to allow  permissive hypertension.  HLD. LDL less than 70. On Lipitor 10 mg continue.  History of psoriasis. On Skyrizi. For now we will monitor. Currently on Decadron.  COPD. No evidence of exacerbation. Currently on Brovana Pulmicort and DuoNebs. Will monitor.

## 2023-05-07 NOTE — Plan of Care (Signed)
  Problem: Nutrition: Goal: Adequate nutrition will be maintained Outcome: Progressing   

## 2023-05-07 NOTE — ED Notes (Signed)
Patients CBG is 114. Nurse notified

## 2023-05-07 NOTE — ED Notes (Signed)
Pt away at MRI.  

## 2023-05-07 NOTE — H&P (Signed)
History and Physical  [facility]  [name] ZOX:[id number] DOB: 1946-01-13 DOA: 05/07/2023  PCP: Benetta Spar, MD  Patient coming from: sent from Dr. Letitia Neri office  Level of care: Telemetry  I have personally briefly reviewed patient's old medical records in [facility]  Chief Complaint: left sided weakness and facial droop  HPI: Kenneth [name] is a 77 year old gentleman with past history of hypertension, psoriasis, former longtime smoker who quit several months ago, former heavy alcohol drinker, quit months ago, who lives alone and is followed by Dr. Felecia Shelling for primary care.  Patient was discovered by son earlier today with difficulty walking and a facial droop and weakness on the left upper and lower extremities.  He was last known to be well 2 weeks ago.  He was sent to see his primary care physician today and subsequently sent to the emergency department for further evaluation and concern for stroke.  Patient reportedly had been started on Skyrizi pen monthly injections for treatment of a severe case of psoriasis by a dermatologist.  He has noticed improvement since starting this treatment.    CT scan was notable for small vessel ischemic changes and Redemonstrated chronic cortical/subcortical infarcts within the anteromedial frontal lobes bilaterally, right frontal lobe and left frontal lobe/left insula.  Dr. Hyacinth Meeker discussed case with neurology on call and recommendation is to admit to [facility] for full stroke work up and neurology consultation on 05/08/23.       Past Medical History:  Diagnosis Date   Hypertension    Psoriasis     Past Surgical History:  Procedure Laterality Date   HERNIA REPAIR       reports that he has quit smoking. His smoking use included cigarettes. He has never used smokeless tobacco. He reports current alcohol use. He reports that he does not currently use drugs.  No Known Allergies  History reviewed. No pertinent family  history.  Prior to Admission medications   Medication Sig Start Date End Date Taking? Authorizing Provider  atorvastatin (LIPITOR) 10 MG tablet Take 10 mg by mouth at bedtime. 03/26/23  Yes [provider]  SKYRIZI PEN 150 MG/ML pen Inject 1 mL into the skin once. 04/05/23  Yes [provider]  albuterol (VENTOLIN HFA) 108 (90 Base) MCG/ACT inhaler Inhale 2 puffs into the lungs every 6 (six) hours as needed for shortness of breath. 10/01/22   [provider]  amLODipine (NORVASC) 5 MG tablet Take 5 mg by mouth daily. Patient not taking: Reported on 12/13/2022    [provider]  cephALEXin (KEFLEX) 500 MG capsule Take 1 capsule (500 mg total) by mouth 2 (two) times daily. 12/13/22   Benjiman Core, MD  doxycycline (VIBRAMYCIN) 100 MG capsule Take 1 capsule (100 mg total) by mouth 2 (two) times daily. 12/13/22   Benjiman Core, MD  furosemide (LASIX) 20 MG tablet Take 1 tablet (20 mg total) by mouth daily. 12/13/22   Benjiman Core, MD  mineral oil-hydrophilic petrolatum (AQUAPHOR) ointment Apply topically as needed for dry skin. 11/28/22   Sloan Leiter, DO  predniSONE (DELTASONE) 10 MG tablet Take 10 mg by mouth daily. 6,5,4,3,2,1 taper dose Patient not taking: Reported on 11/28/2022 11/22/22   [provider]    Physical Exam: Vitals:   05/07/23 1124 05/07/23 1126  BP:  110/76  Pulse:  (!) 57  Resp:  18  Temp:  97.8 F (36.6 C)  TempSrc:  Oral  SpO2:  97%  Weight: 63.5 kg  Height: 5\' 4"  (1.626 m)    Constitutional: frail cachectic emaciated appears stated age, poorly groomed, pleasant, mild facial droop Eyes: PERRL, lids and conjunctivae normal ENMT: Mucous membranes are pale and dry. Posterior pharynx clear of any exudate or lesions. Edentulous.  Neck: normal, supple, no masses, no thyromegaly Respiratory: clear to auscultation bilaterally, no wheezing, no crackles. Normal respiratory effort. No accessory muscle use.  Cardiovascular:  normal s1, s2 sounds, no murmurs / rubs / gallops. 1-2+ bilateral lower extremity edema. 2+ pedal pulses. No carotid bruits.  Abdomen: no tenderness, no masses palpated. No hepatosplenomegaly. Bowel sounds positive.  Musculoskeletal: no clubbing / cyanosis. No joint deformity upper and lower extremities. Good ROM, no contractures. Normal muscle tone.  Skin: superficial abrasion on right foot, does not appear infected, severe diffuse psoriasis lesions seen over entire body.  Neurologic: facial droop, mild left hemiparesis.  Psychiatric: poor judgment and insight. Alert and oriented x 3. Normal mood.   Labs on Admission: I have personally reviewed following labs and imaging studies  CBC: Recent Labs  Lab 05/07/23 1308  WBC 5.9  NEUTROABS 3.2  HGB 12.9*  HCT 41.3  MCV 90.6  PLT 289   Basic Metabolic Panel: Recent Labs  Lab 05/07/23 1308  NA 137  K 4.0  CL 104  CO2 26  GLUCOSE 113*  BUN 16  CREATININE 1.07  CALCIUM 8.9   GFR: Estimated Creatinine Clearance: 47.6 mL/min (by C-G formula based on SCr of 1.07 mg/dL). Liver Function Tests: Recent Labs  Lab 05/07/23 1308  AST 25  ALT 16  ALKPHOS 58  BILITOT 1.0  PROT 7.3  ALBUMIN 3.6   No results for input(s): "LIPASE", "AMYLASE" in the last 168 hours. No results for input(s): "AMMONIA" in the last 168 hours. Coagulation Profile: Recent Labs  Lab 05/07/23 1308  INR 1.1   Cardiac Enzymes: No results for input(s): "CKTOTAL", "CKMB", "CKMBINDEX", "TROPONINI" in the last 168 hours. BNP (last 3 results) No results for input(s): "PROBNP" in the last 8760 hours. HbA1C: No results for input(s): "HGBA1C" in the last 72 hours. CBG: No results for input(s): "GLUCAP" in the last 168 hours. Lipid Profile: No results for input(s): "CHOL", "HDL", "LDLCALC", "TRIG", "CHOLHDL", "LDLDIRECT" in the last 72 hours. Thyroid Function Tests: No results for input(s): "TSH", "T4TOTAL", "FREET4", "T3FREE", "THYROIDAB" in the last 72  hours. Anemia Panel: No results for input(s): "VITAMINB12", "FOLATE", "FERRITIN", "TIBC", "IRON", "RETICCTPCT" in the last 72 hours. Urine analysis: No results found for: "COLORURINE", "APPEARANCEUR", "LABSPEC", "PHURINE", "GLUCOSEU", "HGBUR", "BILIRUBINUR", "KETONESUR", "PROTEINUR", "UROBILINOGEN", "NITRITE", "LEUKOCYTESUR"  Radiological Exams on Admission: MR BRAIN WO CONTRAST  Result Date: 05/07/2023 CLINICAL DATA:  Neuro deficit, stroke suspected. EXAM: MRI HEAD WITHOUT CONTRAST TECHNIQUE: Multiplanar, multiecho pulse sequences of the brain and surrounding structures were obtained without intravenous contrast. COMPARISON:  Same-day CT head FINDINGS: Brain: There is a small focus of diffusion restriction in the left cerebellar hemisphere consistent with a small acute infarct. There is no hemorrhage or mass effect. There is no other evidence of acute infarct There is no acute intracranial hemorrhage or extra-axial fluid collection. Background parenchymal volume loss with prominence of the ventricular system and extra-axial CSF spaces is unchanged. Encephalomalacia and gliosis in the bilateral frontal lobes is again seen consistent with prior infarcts. There are multiple additional small remote infarcts in the bilateral cerebellar hemispheres. Additional patchy and confluent FLAIR signal abnormality in the supratentorial white matter is consistent with moderate underlying chronic small-vessel ischemic change. The pituitary and suprasellar region are  normal. There is no mass lesion. There is no mass effect or midline shift. Vascular: Normal flow voids. Skull and upper cervical spine: There is no suspicious marrow signal abnormality. There is degenerative change of the upper cervical spine with multilevel severe spinal canal stenosis and cord compression, incompletely evaluated. Sinuses/Orbits: The paranasal sinuses are clear. The globes and orbits are unremarkable. Other: There is trace fluid in the left  mastoid tip. IMPRESSION: 1. 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 2. Remote infarcts and background chronic small-vessel ischemic change as above. 3. 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 Consider dedicated cervical spine MRI as indicated. Electronically Signed   By: Lesia Hausen M.D.   On: 05/07/2023 15:26   CT HEAD WO CONTRAST  Result Date: 05/07/2023 CLINICAL DATA:  Provided history: Neuro deficit, acute, stroke suspected. Left-sided weakness. Difficulty ambulating. Altered speech. Vision changes. Dizziness. Headache. EXAM: CT HEAD WITHOUT CONTRAST TECHNIQUE: Contiguous axial images were obtained from the base of the skull through the vertex without intravenous contrast. RADIATION DOSE REDUCTION: This exam was performed according to the departmental dose-optimization program which includes automated exposure control, adjustment of the mA and/or kV according to patient size and/or use of iterative reconstruction technique. COMPARISON:  Head CT 07/30/2010. FINDINGS: Brain: Redemonstrated foci of chronic encephalomalacia/gliosis within the anteromedial frontal lobes bilaterally, mid right frontal lobe and left frontal operculum/left insula consistent with chronic infarcts. Background patchy and ill-defined hypoattenuation within the cerebral white matter, nonspecific but compatible with moderate chronic small vessel ischemic disease. There is no acute intracranial hemorrhage. No acute demarcated cortical infarct. No extra-axial fluid collection. No evidence of an intracranial mass. No midline shift. Vascular: No hyperdense vessel.  Atherosclerotic calcifications. Skull: No fracture or aggressive osseous lesion. Sinuses/Orbits: No mass or acute finding within the imaged orbits. Trace mucosal thickening within the right maxillary sinus at the imaged levels (with associated chronic reactive  osteitis). Small volume secretions within the right sphenoid sinus. IMPRESSION: 1.  No evidence of an acute intracranial abnormality. 2. 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 3. Background moderate chronic small vessel ischemic changes within the cerebral white matter. 4. Mild paranasal sinus disease at the imaged levels. Electronically Signed   By: Jackey Loge D.O.   On: 05/07/2023 13:01    Assessment/Plan Principal Problem:   Acute CVA (cerebrovascular accident) [facility]) Active Problems:   Lower extremity edema   Psoriasis   Edentulous   Former smoker   Former alcohol abuser   Lives alone   Gait instability   Left-sided weakness   Failure to thrive in adult   Facial droop   Working diagnosis of acute CVA - suspect he has had a remote CVA given presenting symptoms - he has a mild left sided hemiparesis - he has a mild facial droop - admit for stroke work up  - MRI brain without contrast ordered. MRA if he is stroke positive - PT/OT/SLP evaluation requested  - swallow screen requested - neuro checks requested  - aspirin 81 mg daily     Right foot superficial abrasion - probably from frequent falls at home - wound care RN requested for wound care recommendations  Falls Frequently / Gait instability / Physical Debility  - Pt reports recently falling frequently at home - denies ever hitting head - MRI brain ordered - PT evaluation requested   Adult Failure  to thrive - Pt has had a significant decline over last 6 months - pt seems to be in very poor overall condition with evidence of poor self care  - consult TOC to investigate if APS referral needed  Psoriasis - pt reports that this is improving since he started Skyrizi injections  Bilateral Lower Extremity Edema  - he has been taking daily furosemide for this and edema is slowly improving  - follow up 2D  echocardiogram for further evaluation    Time Spent: 68 mins   DVT prophylaxis: enoxaparin   Code Status: Full   Family Communication: son at bedside updated 05/07/23   Disposition Plan: TBD   Consults called:   Admission status: OB  Level of care: Telemetry Standley Dakins MD [facility] How to contact the [facility] Attending or Consulting provider 7A - 7P or covering provider during after hours 7P -7A, for this patient?  Check the care team in [facility] and look for a) attending/consulting [facility] provider listed and b) the [facility] team listed Log into [email or web address] and use [facility]'s universal password to access. If you do not have the password, please contact the hospital operator. Locate the [facility] provider you are looking for under [facility] and page to a number that you can be directly reached. If you still have difficulty reaching the provider, please page the [facility] (Director on Call) for the Hospitalists listed on amion for assistance.   If 7PM-7AM, please contact night-coverage [email or web address] Password [facility]  05/07/2023, 3:35 PM

## 2023-05-07 NOTE — Progress Notes (Signed)
*  PRELIMINARY RESULTS* Echocardiogram 2D Echocardiogram has been performed.  Kenneth [name] 05/07/2023, 4:20 PM

## 2023-05-07 NOTE — ED Notes (Addendum)
This is NOT a code [place]. Verified with EDP, and pt is not currently having a stroke or in stroke timing window.

## 2023-05-07 NOTE — ED Notes (Signed)
Pt does not need swallow screen. He is able to swallow liquids along with food. His brother is getting them food now.

## 2023-05-08 ENCOUNTER — Observation Stay (HOSPITAL_COMMUNITY): Payer: 59

## 2023-05-08 DIAGNOSIS — E782 Mixed hyperlipidemia: Secondary | ICD-10-CM

## 2023-05-08 DIAGNOSIS — R627 Adult failure to thrive: Secondary | ICD-10-CM | POA: Diagnosis not present

## 2023-05-08 DIAGNOSIS — I639 Cerebral infarction, unspecified: Secondary | ICD-10-CM | POA: Diagnosis not present

## 2023-05-08 DIAGNOSIS — R2681 Unsteadiness on feet: Secondary | ICD-10-CM | POA: Diagnosis not present

## 2023-05-08 LAB — FOLATE: Folate: 8.1 ng/mL (ref >5.9)

## 2023-05-08 LAB — VITAMIN B12: Vitamin B-12: 358 pg/mL (ref 180–914)

## 2023-05-08 LAB — TSH: TSH: 0.829 u[IU]/mL (ref 0.350–4.500)

## 2023-05-08 LAB — T4, FREE: Free T4: 1.06 ng/dL (ref 0.61–1.12)

## 2023-05-08 MED ORDER — LORAZEPAM 2 MG/ML IJ SOLN
1.0000 mg | Freq: Once | INTRAMUSCULAR | Status: AC
  Administered 2023-05-08: 1 mg via INTRAVENOUS
  Filled 2023-05-08: qty 1

## 2023-05-08 MED ORDER — BUDESONIDE 0.5 MG/2ML IN SUSP
0.5000 mg | Freq: Two times a day (BID) | RESPIRATORY_TRACT | Status: DC
  Administered 2023-05-08 – 2023-05-15 (×13): 0.5 mg via RESPIRATORY_TRACT
  Filled 2023-05-08 (×14): qty 2

## 2023-05-08 MED ORDER — LORAZEPAM 2 MG/ML IJ SOLN: 1.0000 mg | Freq: Once | INTRAMUSCULAR | Status: DC

## 2023-05-08 MED ORDER — CLOPIDOGREL BISULFATE 75 MG PO TABS
75.0000 mg | ORAL_TABLET | Freq: Every day | ORAL | Status: DC
  Administered 2023-05-08: 75 mg via ORAL
  Filled 2023-05-08: qty 1

## 2023-05-08 MED ORDER — METHYLPREDNISOLONE SODIUM SUCC 40 MG IJ SOLR
40.0000 mg | Freq: Two times a day (BID) | INTRAMUSCULAR | Status: DC
  Administered 2023-05-08: 40 mg via INTRAVENOUS
  Filled 2023-05-08: qty 1

## 2023-05-08 MED ORDER — IPRATROPIUM-ALBUTEROL 0.5-2.5 (3) MG/3ML IN SOLN
3.0000 mL | Freq: Two times a day (BID) | RESPIRATORY_TRACT | Status: DC
  Administered 2023-05-09 – 2023-05-15 (×12): 3 mL via RESPIRATORY_TRACT
  Filled 2023-05-08 (×12): qty 3

## 2023-05-08 MED ORDER — IOHEXOL 350 MG/ML SOLN
75.0000 mL | Freq: Once | INTRAVENOUS | Status: AC | PRN
  Administered 2023-05-08: 75 mL via INTRAVENOUS

## 2023-05-08 MED ORDER — CLOPIDOGREL BISULFATE 75 MG PO TABS: 75.0000 mg | ORAL_TABLET | Freq: Every day | ORAL | Status: DC

## 2023-05-08 MED ORDER — IPRATROPIUM-ALBUTEROL 0.5-2.5 (3) MG/3ML IN SOLN
3.0000 mL | Freq: Four times a day (QID) | RESPIRATORY_TRACT | Status: DC
  Filled 2023-05-08: qty 3

## 2023-05-08 MED ORDER — ARFORMOTEROL TARTRATE 15 MCG/2ML IN NEBU
15.0000 ug | INHALATION_SOLUTION | Freq: Two times a day (BID) | RESPIRATORY_TRACT | Status: DC
  Administered 2023-05-08 – 2023-05-15 (×13): 15 ug via RESPIRATORY_TRACT
  Filled 2023-05-08 (×14): qty 2

## 2023-05-08 MED ORDER — DEXAMETHASONE 4 MG PO TABS
4.0000 mg | ORAL_TABLET | Freq: Three times a day (TID) | ORAL | Status: DC
  Administered 2023-05-08 – 2023-05-10 (×7): 4 mg via ORAL
  Filled 2023-05-08 (×8): qty 1

## 2023-05-08 MED ORDER — ATORVASTATIN CALCIUM 10 MG PO TABS
10.0000 mg | ORAL_TABLET | Freq: Every day | ORAL | Status: DC
  Administered 2023-05-08 – 2023-05-15 (×8): 10 mg via ORAL
  Filled 2023-05-08 (×8): qty 1

## 2023-05-08 NOTE — Plan of Care (Signed)
  Problem: Acute Rehab OT Goals (only OT should resolve) Goal: Pt. Will Perform Eating Flowsheets (Taken 05/08/2023 0918) Pt Will Perform Eating:  with set-up  sitting Goal: Pt. Will Perform Grooming Flowsheets (Taken 05/08/2023 [phone]) Pt Will Perform Grooming:  with set-up  sitting Goal: Pt. Will Perform Upper Body Bathing Flowsheets (Taken 05/08/2023 0918) Pt Will Perform Upper Body Bathing:  with set-up  sitting Goal: Pt. Will Perform Lower Body Bathing Flowsheets (Taken 05/08/2023 0918) Pt Will Perform Lower Body Bathing:  with min assist  sitting/lateral leans Goal: Pt. Will Perform Upper Body Dressing Flowsheets (Taken 05/08/2023 0918) Pt Will Perform Upper Body Dressing:  with set-up  sitting Goal: Pt. Will Perform Lower Body Dressing Flowsheets (Taken 05/08/2023 0918) Pt Will Perform Lower Body Dressing:  with min assist  sitting/lateral leans Goal: Pt. Will Transfer To Toilet Flowsheets (Taken 05/08/2023 [phone]) Pt Will Transfer to Toilet:  with min guard assist  ambulating Goal: Pt. Will Perform Toileting-Clothing Manipulation Flowsheets (Taken 05/08/2023 0918) Pt Will Perform Toileting - Clothing Manipulation and hygiene:  sitting/lateral leans  with min guard assist Goal: Pt/Caregiver Will Perform Home Exercise Program Flowsheets (Taken 05/08/2023 [phone]) Pt/caregiver will Perform Home Exercise Program:  Increased ROM  Increased strength  Left upper extremity  With minimal assist  Kalyiah Saintil OT, MOT

## 2023-05-08 NOTE — Consult Note (Signed)
I connected with  Kenneth [name] on 05/08/23 by a video enabled telemedicine application and verified that I am speaking with the correct person using two identifiers.   I discussed the limitations of evaluation and management by telemedicine. The patient expressed understanding and agreed to proceed.  [facility] of patient[facility] Location of physician: [facility]   Neurology Consultation Reason for Consult: Stroke Referring Physician: Dr. Standley Dakins  CC: Left-sided weakness and facial droop  History is obtained from: Patient, chart review  HPI: Kenneth [name] is a 77 y.o. male with history of hypertension, former smoker (quit several months ago), former heavy alcohol user (quit few months ago) who presented to emergency room with left-sided weakness and dizziness.  Patient is a poor historian but states symptoms started more than 2 weeks ago.  States he woke up with them in the morning.  Also reports pain in the back of his neck radiating down his left arm.  He went to his primary care's office yesterday and was told to come to emergency room due to concern for stroke.  MRI brain on arrival showed left cerebellar stroke.  Therefore neurology was consulted for further recommendations.   Last known normal about 2 weeks ago No tPA as outside window No thrombectomy as outside window Event happened at home mRS 1  ROS: All other systems reviewed and negative except as noted in the HPI.   Past Medical History:  Diagnosis Date   Hypertension    Psoriasis    Social History:  reports that he has quit smoking. His smoking use included cigarettes. He has never used smokeless tobacco. He reports current alcohol use. He reports that he does not currently use drugs.   Medications Prior to Admission  Medication Sig Dispense Refill Last Dose   albuterol (VENTOLIN HFA) 108 (90 Base) MCG/ACT inhaler Inhale 2 puffs into the lungs every 6 (six) hours as needed for shortness  of breath.   UNK   amLODipine (NORVASC) 5 MG tablet Take 5 mg by mouth daily.   05/06/2023   atorvastatin (LIPITOR) 10 MG tablet Take 10 mg by mouth at bedtime.   05/06/2023   furosemide (LASIX) 20 MG tablet Take 1 tablet (20 mg total) by mouth daily. 10 tablet 0 05/06/2023   mineral oil-hydrophilic petrolatum (AQUAPHOR) ointment Apply topically as needed for dry skin. (Patient taking differently: Apply 1 Application topically as needed for dry skin.) 420 g 0 UNK   SKYRIZI PEN 150 MG/ML pen Inject 1 mL into the skin once.   Past Month   cephALEXin (KEFLEX) 500 MG capsule Take 1 capsule (500 mg total) by mouth 2 (two) times daily. (Patient not taking: Reported on 05/07/2023) 14 capsule 0 Completed Course   doxycycline (VIBRAMYCIN) 100 MG capsule Take 1 capsule (100 mg total) by mouth 2 (two) times daily. (Patient not taking: Reported on 05/07/2023) 14 capsule 0 Completed Course   predniSONE (DELTASONE) 10 MG tablet Take 10 mg by mouth daily. 6,5,4,3,2,1 taper dose (Patient not taking: Reported on 11/28/2022)   Completed Course      Exam: Current vital signs: BP 119/73 (BP Location: Left Arm)   Pulse 64   Temp 97.9 F (36.6 C) (Oral)   Resp 18   Ht 5\' 4"  (1.626 m)   Wt 63.5 kg   SpO2 99%   BMI 24.03 kg/m  Vital signs in last 24 hours: Temp:  [97.5 F (36.4 C)-97.9 F (36.6 C)] 97.9 F (36.6 C) (06/26 0324) Pulse  Rate:  [59-65] 64 (06/26 0324) Resp:  [14-18] 18 (06/26 0324) BP: (119-164)/(73-82) 119/73 (06/26 0324) SpO2:  [98 %-100 %] 99 % (06/26 0324)   Physical Exam  Constitutional: Appears well-developed and well-nourished.  Psych: Affect appropriate to situation Neuro: AOx3, no aphasia, cranial nerves II through XII appear grossly intact, antigravity strength in right upper extremity without drift, 3/5 in left upper extremity with drift to bed, antigravity strength in bilateral lower extremities without drift, FTN intact in right upper extremity, sensation intact to light  touch  NIHSS 3  1A: Level of consciousness --> 0 = Alert; keenly responsive 1B: Ask month and age --> 0 = Both questions right 1C: 'Blink eyes' & 'squeeze hands' --> 0 = Performs both tasks 2: Horizontal extraocular movements --> 0 = Normal 3: Visual fields --> 0 = No visual loss 4: Facial palsy --> 0 = Normal symmetry 5A: Left arm motor drift --> 2 = Some effort against gravity 5B: Right arm motor drift --> 0 = No drift for 10 seconds 6A: Left leg motor drift --> 0 = No drift for 5 seconds 6B: Right leg motor drift --> 0 = No drift for 5 seconds 7: Limb Ataxia --> 1 = Ataxia in 1 Limb 8: Sensation --> 0 = Normal; no sensory loss 9: Language/aphasia --> 0 = Normal; no aphasia 10: Dysarthria --> 0 = Normal 11: Extinction/inattention --> 0 = No abnormality   I have reviewed labs in epic and the results pertinent to this consultation are: CBC:  Recent Labs  Lab 05/07/23 1308 05/07/23 1317  WBC 5.9  --   NEUTROABS 3.2  --   HGB 12.9* 14.3  HCT 41.3 42.0  MCV 90.6  --   PLT 289  --     Basic Metabolic Panel:  Lab Results  Component Value Date   NA 141 05/07/2023   K 4.2 05/07/2023   CO2 26 05/07/2023   GLUCOSE 111 (H) 05/07/2023   BUN 15 05/07/2023   CREATININE 1.10 05/07/2023   CALCIUM 8.9 05/07/2023   GFRNONAA >60 05/07/2023   GFRAA  07/30/2010    >60        The eGFR has been calculated using the MDRD equation. This calculation has not been validated in all clinical situations. eGFR's persistently <60 mL/min signify possible Chronic Kidney Disease.   Lipid Panel:  Lab Results  Component Value Date   LDLCALC 64 05/07/2023   HgbA1c: No results found for: "HGBA1C" Urine Drug Screen:     Component Value Date/Time   LABOPIA NONE DETECTED 07/30/2010 0244   COCAINSCRNUR POSITIVE (A) 07/30/2010 0244   LABBENZ NONE DETECTED 07/30/2010 0244   AMPHETMU NONE DETECTED 07/30/2010 0244   THCU NONE DETECTED 07/30/2010 0244   LABBARB  07/30/2010 0244    NONE  DETECTED        DRUG SCREEN FOR MEDICAL PURPOSES ONLY.  IF CONFIRMATION IS NEEDED FOR ANY PURPOSE, NOTIFY LAB WITHIN 5 DAYS.        LOWEST DETECTABLE LIMITS FOR URINE DRUG SCREEN Drug Class       Cutoff (ng/mL) Amphetamine      1000 Barbiturate      200 Benzodiazepine   200 Tricyclics       300 Opiates          300 Cocaine          300 THC              50    Alcohol Level  Component Value Date/Time   ETH <10 05/07/2023 1308     I have reviewed the images obtained:  CT Head without contrast:   MRI Brain wo contrast 05/07/2023:  Small acute infarct in the left cerebellar hemisphere. Encephalomalacia and gliosis in the bilateral frontal lobes is again seen consistent with prior infarcts. There are multiple additional small remote infarcts in the bilateral cerebellar hemispheres. Background chronic small-vessel ischemic change as above.     ASSESSMENT/PLAN: 77 year old male with past medical history of hypertension presented with left-sided weakness.  Acute ischemic stroke -Etiology: Likely small vessel disease.  However also has prior frontal strokes which could be embolic  Recommendations: -Patient has had trouble staying still for MRI scan and his renal function appears normal.  Therefore will order CTA head and neck to look for intracranial stenosis -In the meantime recommend aspirin 81 mg daily and Plavix 75 mg daily.  If no intracranial stenosis, would recommend dual antiplatelets for 3 weeks followed by aspirin 81 mg daily only.  If there is significant intracranial stenosis, would recommend dual antiplatelet for 3 months followed by aspirin milligrams daily alone -LDL is 69.  Therefore can continue atorvastatin 10 mg daily -TTE did not show any thrombus, PFO.  Recommend 30-day event monitor -PT/OT/swallow eval -Goal blood pressure: Normotension -Follow-up with Dr. Pearlean Brownie in 3 months (order placed) -Discussed plan with Dr. Arbutus Leas via secure chat  Left upper extremity  weakness -Patient does have left cerebellar stroke.  However this does not explain the significant left upper extremity weakness as well as pain.  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  Neurosurgery has been consulted and patient is being transferred to [facility] for further evaluation   Thank you for allowing [place] to participate in the care of this patient. If you have any further questions, please contact  me or neurohospitalist.   Lindie Spruce Epilepsy [facility]

## 2023-05-08 NOTE — Progress Notes (Signed)
Pt had a good night. This RN cleansed and dressed left foot with saline, xeroform, and foam dressing.Wound is about the size of a nickel with yellow drainage. No acute events overnight. Kellogg RN

## 2023-05-08 NOTE — Progress Notes (Addendum)
PROGRESS NOTE  Kenneth [name] ZOX:[id number] DOB: 04/11/1946 DOA: 05/07/2023 PCP: Benetta Spar, MD  Brief History:  77 year old gentleman with past history of hypertension, psoriasis, former longtime smoker who quit several months ago, former heavy alcohol drinker, quit months ago, who lives alone and is followed by Dr. Felecia Shelling for primary care.  Patient was discovered by brother on the day of admission with difficulty walking and a facial droop and weakness on the left upper extremity.  He was sent to see his primary care physician today and subsequently sent to the emergency department for further evaluation and concern for stroke.    Upon further questioning, the patient has been complaining of neck pain radiating to his left shoulder and down his arm for the past 2 weeks.  This has been associated with left arm weakness for the past 2 weeks.  He has noted bilateral lower extremity weakness for 2 weeks.  He feels like his legs have been weaker for this period of time having more difficulty walking.  At baseline, the patient uses a walker, but he feels like he has been getting weaker.  As result, he went to his PCP on the day of admission.  Patient reportedly had been started on Skyrizi pen monthly injections for treatment of a severe case of psoriasis by a dermatologist.  He has noticed improvement since starting this treatment.    In addition, patient has been complaining of a nonproductive cough and shortness of breath for the past week.  He quit smoking several months ago.  He has smoked over 40 pack years.  CT scan was notable for small vessel ischemic changes and Redemonstrated chronic cortical/subcortical infarcts within the anteromedial frontal lobes bilaterally, right frontal lobe and left frontal lobe/left insula.  Dr. Hyacinth Meeker discussed case with neurology on call and recommendation is to admit to [facility] for full stroke work up and neurology consultation on 05/08/23.        Assessment/Plan: Acute ischemic stroke -Appreciate Neurology Consult -PT/OT evaluation -Speech therapy eval -CT brain--no acute finding -MRI brain--small acute infarct left cerebellar hemisphere; question cord compression and cervical spine -Echo--EF 55-60%, no WMA, G1DD -LDL--64 -HbA1C--pending -Antiplatelet--aspirin 81 mg and Plavix 75 mg x 3 weeks, then ASA IF no intracranial stenosis on CTA Head and neck -neurology recommends 30 day heart monitor after d/c  Left arm weakness -Concerned about cervical myelopathy -Case discussed with neurosurgery--Dr. Cabbell>> suggested repeat MRI cervical spine due to poor quality of original imaging -discussed with Dr. Franky Macho again after repeat imaging>>transfer to [facility] for neurosurgery eval>>start dexamethasone -please contact Dr. 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  COPD exacerbation -Start DuoNebs -Start dexamethasone in lieu of solumedrol -Start Brovana -Start Pulmicort  Failure to thrive -B12--358 -Folate--8.1 -TSH--0.829  Essential hypertension -Allow for permissive hypertension in the setting of acute stroke  Hyperlipidemia -Continue statin -LDL 64         Family Communication: no  Family at bedside  Consultants:  neurosurgery--Cabbell;  Neurology--Yadav  Code Status:  FULL   DVT Prophylaxis: Baxter Lovenox   Procedures: As Listed in Progress Note Above  Antibiotics: None      Subjective: Patient complains of some shortness of breath and dry cough.  He denies any chest pain, nausea vomiting, diarrhea, abdominal pain,  fevers, chills, headache.  Objective: Vitals:   05/07/23 1820 05/07/23 2023 05/08/23 0013 05/08/23 0324  BP:  120/76 (!) 142/78 119/73  Pulse:  62 65  64  Resp:  18 16 18   Temp: (!) 97.5 F (36.4 C) 97.8 F (36.6 C)  97.9 F (36.6 C)  TempSrc: Axillary Axillary  Oral  SpO2:  100% 98% 99%  Weight:      Height:        Intake/Output Summary (Last 24 hours) at 05/08/2023 0734 Last data filed at 05/08/2023 0304 Gross per 24 hour  Intake 861.73 ml  Output 175 ml  Net 686.73 ml   Weight change:  Exam:  General:  Pt is alert, follows commands appropriately, not in acute distress HEENT: No icterus, No thrush, No neck mass, Gordon/AT Cardiovascular: RRR, S1/S2, no rubs, no gallops Respiratory: Bibasilar rales.  Bibasilar wheeze. Abdomen: Soft/+BS, non tender, non distended, no guarding Extremities: 1 + LE edema, No lymphangitis, No petechiae, No rashes, no synovitis Neuro:  CN II-XII intact, strength 4-/5 in RUE, RLE, strength 3/5 LUE, 4-/5 LLE; sensation intact bilateral; no dysmetria; babinski equivocal    Data Reviewed: I have personally reviewed following labs and imaging studies Basic Metabolic Panel: Recent Labs  Lab 05/07/23 1308 05/07/23 1317  NA 137 141  K 4.0 4.2  CL 104 107  CO2 26  --   GLUCOSE 113* 111*  BUN 16 15  CREATININE 1.07 1.10  CALCIUM 8.9  --    Liver Function Tests: Recent Labs  Lab 05/07/23 1308  AST 25  ALT 16  ALKPHOS 58  BILITOT 1.0  PROT 7.3  ALBUMIN 3.6   No results for input(s): "LIPASE", "AMYLASE" in the last 168 hours. No results for input(s): "AMMONIA" in the last 168 hours. Coagulation Profile: Recent Labs  Lab 05/07/23 1308  INR 1.1   CBC: Recent Labs  Lab 05/07/23 1308 05/07/23 1317  WBC 5.9  --   NEUTROABS 3.2  --   HGB 12.9* 14.3  HCT 41.3 42.0  MCV 90.6  --   PLT 289  --    Cardiac Enzymes: No results for input(s): "CKTOTAL", "CKMB", "CKMBINDEX", "TROPONINI" in the last 168 hours. BNP: Invalid input(s): "POCBNP" CBG: No results for input(s): "GLUCAP" in the last 168 hours. HbA1C: No results for input(s): "HGBA1C" in the last 72 hours. Urine analysis: No  results found for: "COLORURINE", "APPEARANCEUR", "LABSPEC", "PHURINE", "GLUCOSEU", "HGBUR", "BILIRUBINUR", "KETONESUR", "PROTEINUR", "UROBILINOGEN", "NITRITE", "LEUKOCYTESUR" Sepsis Labs: @LABRCNTIP (procalcitonin:4,lacticidven:4) )No results found for this or any previous visit (from the past 240 hour(s)).   Scheduled Meds:   stroke: early stages of recovery book   Does not apply Once   aspirin EC  81 mg Oral Daily   enoxaparin (LOVENOX) injection  40 mg Subcutaneous Q24H   famotidine  20 mg Oral Daily   LORazepam  1 mg Intravenous Once   Continuous Infusions:  sodium chloride 50 mL/hr at 05/08/23 0141    Procedures/Studies: MR CERVICAL SPINE WO CONTRAST  Result Date: 05/07/2023 CLINICAL DATA:  Neck pain, ataxia EXAM: MRI CERVICAL SPINE WITHOUT CONTRAST TECHNIQUE: Multiplanar, multisequence MR imaging of the cervical spine was performed. No intravenous contrast was administered. COMPARISON:  None Available. FINDINGS: Evaluation is significantly limited by motion artifact. Limited utility of the sagittal sequences. The axial sequences are nearly nondiagnostic. Alignment: Straightening of the normal cervical lordosis. Vertebrae: No acute fracture, evidence of discitis, or suspicious osseous lesion. Congenitally short pedicles, which narrow the AP diameter of the spinal canal. Cord:  Suspect increased T2 signal in the spinal cord at C3-C4 and C4-C5 (series 7, image 8), although this cannot be corroborated on the axial images. Posterior Fossa, vertebral arteries, paraspinal tissues: Trace prevertebral fluid. Disc levels: Motion precludes evaluation of the neural foramina, although there appears to the significant facet and uncovertebral hypertrophy, likely resulting in advanced neural foraminal narrowing at most cervical levels. C2-C3: No significant disc bulge. Mild to moderate spinal canal stenosis. C3-C4: Disc height loss and moderate disc osteophyte complex. Severe spinal canal stenosis. C4-C5:  Disc height loss and disc osteophyte complex. Severe spinal canal stenosis. C5-C6: Disc height loss and disc osteophyte complex. Moderate to severe spinal canal stenosis. C6-C7: Disc height loss and mild disc osteophyte complex. Moderate spinal canal stenosis. C7-T1: Mild disc bulge.  Mild spinal canal stenosis. T1-T2: Disc height loss and mild disc osteophyte complex. Mild spinal canal stenosis. IMPRESSION: 1. Evaluation is significantly limited by motion artifact, with limited utility of the sagittal sequences and nearly nondiagnostic axial sequences. Consider repeating examine the patient is better able to tolerate it. 2. Congenitally short pedicles, which narrow the AP diameter of the spinal canal, with superimposed degenerative disc disease, which causes severe spinal canal stenosis at C3-C4 and C4-C5, moderate to severe spinal canal stenosis at C5-C6, and moderate spinal canal stenosis at C6-C7. 3. Suspect increased T2 signal in the spinal cord at C3-C4 and C4-C5, although this cannot be corroborated on the axial images due to motion. 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 4. Motion precludes evaluation of the neural foramina, although there appears to the significant facet and uncovertebral hypertrophy, likely resulting in advanced neural foraminal narrowing at most cervical levels. Attention on reimaging. Electronically Signed   By: Wiliam Ke M.D.   On: 05/07/2023 17:07   ECHOCARDIOGRAM COMPLETE  Result Date: 05/07/2023    ECHOCARDIOGRAM REPORT   Patient Name:   Kenneth [name] Date of Exam: 05/07/2023 Medical Rec #:  [id number]     Height:       64.0 in Accession #:    [id number]    Weight:       140.0 lb Date of Birth:  [birth year]     BSA:          1.681 m Patient Age:    77 years      BP:           110/76 mmHg Patient Gender: M             HR:           57 bpm. [facility] Procedure: 2D Echo, Cardiac Doppler and Color  Doppler Indications:    Stroke I63.9  History:        Patient has no prior history of Echocardiogram examinations.                 Stroke; Risk Factors:Former Smoker. Former alcohol abuser.  Sonographer:    Celesta Gentile RCS Referring Phys: [phone] [place] L J[facility]NSON IMPRESSIONS  1. Left ventricular ejection fraction, by estimation, is 55 to 60%. The left ventricle has normal function. The left ventricle has no regional wall motion abnormalities. There is mild left ventricular hypertrophy. Left ventricular diastolic parameters are consistent with Grade I diastolic dysfunction (impaired relaxation).  2. Right ventricular systolic function is normal. The right ventricular size is normal. Tricuspid regurgitation signal is inadequate for assessing PA pressure.  3. The mitral valve is normal in structure. Trivial  mitral valve regurgitation. No evidence of mitral stenosis.  4. The aortic valve is tricuspid. Aortic valve regurgitation is not visualized. No aortic stenosis is present.  5. Aortic dilatation noted. There is borderline dilatation of the aortic root, measuring 36 mm.  6. The inferior vena cava is normal in size with greater than 50% respiratory variability, suggesting right atrial pressure of 3 mmHg. Comparison(s): No prior Echocardiogram. FINDINGS  Left Ventricle: Left ventricular ejection fraction, by estimation, is 55 to 60%. The left ventricle has normal function. The left ventricle has no regional wall motion abnormalities. The left ventricular internal cavity size was normal in size. There is  mild left ventricular hypertrophy. Left ventricular diastolic parameters are consistent with Grade I diastolic dysfunction (impaired relaxation). Right Ventricle: The right ventricular size is normal. No increase in right ventricular wall thickness. Right ventricular systolic function is normal. Tricuspid regurgitation signal is inadequate for assessing PA pressure. Left Atrium: Left atrial size was normal in size.  Right Atrium: Right atrial size was normal in size. Pericardium: There is no evidence of pericardial effusion. Mitral Valve: The mitral valve is normal in structure. Trivial mitral valve regurgitation. No evidence of mitral valve stenosis. Tricuspid Valve: The tricuspid valve is normal in structure. Tricuspid valve regurgitation is trivial. No evidence of tricuspid stenosis. Aortic Valve: The aortic valve is tricuspid. Aortic valve regurgitation is not visualized. No aortic stenosis is present. Pulmonic Valve: The pulmonic valve was normal in structure. Pulmonic valve regurgitation is trivial. No evidence of pulmonic stenosis. Aorta: Aortic dilatation noted. There is borderline dilatation of the aortic root, measuring 36 mm. Venous: The inferior vena cava is normal in size with greater than 50% respiratory variability, suggesting right atrial pressure of 3 mmHg. IAS/Shunts: No atrial level shunt detected by color flow Doppler.  LEFT VENTRICLE PLAX 2D LVIDd:         4.80 cm   Diastology LVIDs:         3.20 cm   LV e' medial:    6.85 cm/s LV PW:         1.30 cm   LV E/e' medial:  8.6 LV IVS:        1.00 cm   LV e' lateral:   10.60 cm/s LVOT diam:     2.00 cm   LV E/e' lateral: 5.6 LV SV:         57 LV SV Index:   34 LVOT Area:     3.14 cm  RIGHT VENTRICLE RV S prime:     15.40 cm/s TAPSE (M-mode): 2.3 cm LEFT ATRIUM             Index        RIGHT ATRIUM           Index LA diam:        2.90 cm 1.72 cm/m   RA Area:     15.00 cm LA Vol (A2C):   59.0 ml 35.09 ml/m  RA Volume:   38.80 ml  23.08 ml/m LA Vol (A4C):   32.7 ml 19.45 ml/m LA Biplane Vol: 47.3 ml 28.13 ml/m  AORTIC VALVE LVOT Vmax:   84.60 cm/s LVOT Vmean:  53.100 cm/s LVOT VTI:    0.181 m  AORTA Ao Root diam: 3.60 cm MITRAL VALVE MV Area (PHT): 3.42 cm    SHUNTS MV Decel Time: 222 msec    Systemic VTI:  0.18 m MV E velocity: 59.10 cm/s  Systemic Diam: 2.00 cm MV A velocity: 69.20  cm/s MV E/A ratio:  0.85 Vishnu Priya Mallipeddi Electronically signed by  Winfield Rast Mallipeddi Signature Date/Time: 05/07/2023/4:41:58 PM    Final    MR BRAIN WO CONTRAST  Result Date: 05/07/2023 CLINICAL DATA:  Neuro deficit, stroke suspected. EXAM: MRI HEAD WITHOUT CONTRAST TECHNIQUE: Multiplanar, multiecho pulse sequences of the brain and surrounding structures were obtained without intravenous contrast. COMPARISON:  Same-day CT head FINDINGS: Brain: There is a small focus of diffusion restriction in the left cerebellar hemisphere consistent with a small acute infarct. There is no hemorrhage or mass effect. There is no other evidence of acute infarct There is no acute intracranial hemorrhage or extra-axial fluid collection. Background parenchymal volume loss with prominence of the ventricular system and extra-axial CSF spaces is unchanged. Encephalomalacia and gliosis in the bilateral frontal lobes is again seen consistent with prior infarcts. There are multiple additional small remote infarcts in the bilateral cerebellar hemispheres. Additional patchy and confluent FLAIR signal abnormality in the supratentorial white matter is consistent with moderate underlying chronic small-vessel ischemic change. The pituitary and suprasellar region are normal. There is no mass lesion. There is no mass effect or midline shift. Vascular: Normal flow voids. Skull and upper cervical spine: There is no suspicious marrow signal abnormality. There is degenerative change of the upper cervical spine with multilevel severe spinal canal stenosis and cord compression, incompletely evaluated. Sinuses/Orbits: The paranasal sinuses are clear. The globes and orbits are unremarkable. Other: There is trace fluid in the left mastoid tip. IMPRESSION: 1. 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 2. Remote infarcts and background chronic small-vessel ischemic change as above. 3. Degenerative changes in the imaged separate cervical spine resulting in severe spinal canal stenosis with cord compression,  incompletely evaluated. Consider dedicated cervical spine MRI as indicated. Electronically Signed   By: Lesia Hausen M.D.   On: 05/07/2023 15:26   CT HEAD WO CONTRAST  Result Date: 05/07/2023 CLINICAL DATA:  Provided history: Neuro deficit, acute, stroke suspected. Left-sided weakness. Difficulty ambulating. Altered speech. Vision changes. Dizziness. Headache. EXAM: CT HEAD WITHOUT CONTRAST TECHNIQUE: Contiguous axial images were obtained from the base of the skull through the vertex without intravenous contrast. RADIATION DOSE REDUCTION: This exam was performed according to the departmental dose-optimization program which includes automated exposure control, adjustment of the mA and/or kV according to patient size and/or use of iterative reconstruction technique. COMPARISON:  Head CT 07/30/2010. FINDINGS: Brain: Redemonstrated foci of chronic encephalomalacia/gliosis within the anteromedial frontal lobes bilaterally, mid right frontal lobe and left frontal operculum/left insula consistent with chronic infarcts. Background patchy and ill-defined hypoattenuation within the cerebral white matter, nonspecific but compatible with moderate chronic small vessel ischemic disease. There is no acute intracranial hemorrhage. No acute demarcated cortical infarct. No extra-axial fluid collection. No evidence of an intracranial mass. No midline shift. Vascular: No hyperdense vessel.  Atherosclerotic calcifications. Skull: No fracture or aggressive osseous lesion. Sinuses/Orbits: No mass or acute finding within the imaged orbits. Trace mucosal thickening within the right maxillary sinus at the imaged levels (with associated chronic reactive osteitis). Small volume secretions within the right sphenoid sinus. IMPRESSION: 1.  No evidence of an acute intracranial abnormality. 2. Redemonstrated chronic cortical/subcortical infarcts within the anteromedial frontal lobes bilaterally, right frontal lobe and left frontal lobe/left  insula. 3. Background moderate chronic small vessel ischemic changes within the cerebral white matter. 4. Mild paranasal sinus disease at the imaged levels. Electronically Signed   By: Jackey Loge D.O.   On: 05/07/2023 13:01   VAS [place]  LOWER EXTREMITY VENOUS REFLUX  Result Date: 04/26/2023  Lower Venous Reflux Study Patient Name:  Kenneth [name]  Date of Exam:   04/25/2023 Medical Rec #: [id number]      Accession #:    [id number] Date of Birth: 02/08/1946      Patient Gender: M Patient Age:   43 years Exam Location:  [facility] Procedure:      VAS [place] LOWER EXTREMITY VENOUS REFLUX Referring Phys: EMMA COLLINS --------------------------------------------------------------------------------  Indications: Edema. Other Indications: History of cellulitis. Limitations: Movemen. Performing Technologist: Elita Quick RVT  Examination Guidelines: A complete evaluation includes B-mode imaging, spectral Doppler, color Doppler, and power Doppler as needed of all accessible portions of each vessel. Bilateral testing is considered an integral part of a complete examination. Limited examinations for reoccurring indications may be performed as noted. The reflux portion of the exam is performed with the patient in reverse Trendelenburg. Significant venous reflux is defined as >500 ms in the superficial venous system, and >1 second in the deep venous system.  Venous Reflux Times +--------------+---------+------+-----------+------------+--------+ RIGHT         Reflux NoRefluxReflux TimeDiameter cmsComments                         Yes                                  +--------------+---------+------+-----------+------------+--------+ CFV                     yes   >1 second                      +--------------+---------+------+-----------+------------+--------+ FV mid                  yes   >1 second                      +--------------+---------+------+-----------+------------+--------+  Popliteal     no                                             +--------------+---------+------+-----------+------------+--------+ GSV at [facility]    no                            0.83             +--------------+---------+------+-----------+------------+--------+ GSV prox thighno                            0.73             +--------------+---------+------+-----------+------------+--------+ GSV mid thigh no                            0.56             +--------------+---------+------+-----------+------------+--------+ GSV dist thighno                            0.57             +--------------+---------+------+-----------+------------+--------+ GSV at knee   no  0.58             +--------------+---------+------+-----------+------------+--------+ GSV prox calf           yes    >500 ms      0.50             +--------------+---------+------+-----------+------------+--------+ GSV mid calf  no                            0.31             +--------------+---------+------+-----------+------------+--------+ GSV dist calf no                            0.26             +--------------+---------+------+-----------+------------+--------+ SSV Pop Fossa no                            0.31             +--------------+---------+------+-----------+------------+--------+ SSV prox calf no                            0.33             +--------------+---------+------+-----------+------------+--------+ SSV mid calf  no                            0.26             +--------------+---------+------+-----------+------------+--------+ AASV O        no                            0.22             +--------------+---------+------+-----------+------------+--------+ AASV P        no                            0.17             +--------------+---------+------+-----------+------------+--------+ AASV M                                               NV       +--------------+---------+------+-----------+------------+--------+   Summary: Right: - No evidence of deep vein thrombosis seen in the right lower extremity, from the common femoral through the popliteal veins. - No evidence of superficial venous thrombosis in the right lower extremity. - No evidence of superficial venous reflux seen in the right short saphenous vein. - Venous reflux is noted in the right common femoral vein. - Venous reflux is noted in the right greater saphenous vein in the calf. - Venous reflux is noted in the right femoral vein.  *See table(s) above for measurements and observations. Electronically signed by Gerarda Fraction on 04/26/2023 at 5:57:25 PM.    Final     Catarina Hartshorn, DO  [facility]  If 7PM-7AM, please contact night-coverage [email or web address] Password [facility] 05/08/2023, 7:34 AM   LOS: 0 days

## 2023-05-08 NOTE — Plan of Care (Signed)
  Problem: Acute Rehab PT Goals(only PT should resolve) Goal: Pt Will Go Supine/Side To Sit Outcome: Progressing Flowsheets (Taken 05/08/2023 1157) Pt will go Supine/Side to Sit:  with minimal assist  with moderate assist Goal: Patient Will Transfer Sit To/From Stand Outcome: Progressing Flowsheets (Taken 05/08/2023 1157) Patient will transfer sit to/from stand:  with minimal assist  with moderate assist Goal: Pt Will Transfer Bed To Chair/Chair To Bed Outcome: Progressing Flowsheets (Taken 05/08/2023 1157) Pt will Transfer Bed to Chair/Chair to Bed:  with min assist  with mod assist Goal: Pt Will Ambulate Outcome: Progressing Flowsheets (Taken 05/08/2023 1157) Pt will Ambulate:  25 feet  with moderate assist  with rolling walker   11:58 AM, 05/08/23 Ocie Bob, MPT Physical Therapist with [facility] [phone] office [phone] mobile phone

## 2023-05-08 NOTE — Evaluation (Signed)
Clinical/Bedside Swallow Evaluation Patient Details  Name: Kenneth [name] MRN: [id number] Date of Birth: [birth year]  Today's Date: 05/08/2023 Time: SLP Start Time (ACUTE ONLY): 1315 SLP Stop Time (ACUTE ONLY): 1340 SLP Time Calculation (min) (ACUTE ONLY): 25 min  Past Medical History:  Past Medical History:  Diagnosis Date   Hypertension    Psoriasis    Past Surgical History:  Past Surgical History:  Procedure Laterality Date   HERNIA REPAIR     HPI:  Kenneth [name] is a 77 year old gentleman with past history of hypertension, psoriasis, former longtime smoker who quit several months ago, former heavy alcohol drinker, quit months ago, who lives alone and is followed by Dr. Felecia Shelling for primary care.  Patient was discovered by son earlier today with difficulty walking and a facial droop and weakness on the left upper and lower extremities.  He was last known to be well 2 weeks ago.  He was sent to see his primary care physician today and subsequently sent to the emergency department for further evaluation and concern for stroke.  Patient reportedly had been started on Skyrizi pen monthly injections for treatment of a severe case of psoriasis by a dermatologist.  He has noticed improvement since starting this treatment.    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 Remote infarcts and background chronic small-vessel ischemic  change" BSE and SLE requested.    Assessment / Plan / Recommendation  Clinical Impression  Clinical swallowing evaluation completed while Pt was sitting upright in chair; Pt joked and laughed with SLP. Pt is edentulous but was eating his regular breakfast tray without difficulty. Note Pt takes very large bites, is impulsive and demonstrates some prolonged mastication d/t edentulous status, however managed to eat regular bacon, sausage and bread without overt s/sx of aspiration. Further note large consecutive sips of sweet tea via cup and straw without  overt s/sx of aspiration. Recommend continue with regular diet and thin liquids. Meds are ok whole with liquids. There are no further ST needs for dysphagia, our service will sign off for dysphagia at this time. SLE note to follow. Thank you for this referral, SLP Visit Diagnosis: Dysphagia, oropharyngeal phase (R13.12)    Aspiration Risk  Mild aspiration risk (d/t impulsivity)    Diet Recommendation Regular;Thin liquid    Liquid Administration via: Cup;Straw Medication Administration: Whole meds with liquid Supervision: Patient able to self feed Compensations: Minimize environmental distractions;Slow rate;Small sips/bites Postural Changes: Seated upright at 90 degrees    Other  Recommendations Oral Care Recommendations: Oral care BID    Recommendations for follow up therapy are one component of a multi-disciplinary discharge planning process, led by the attending physician.  Recommendations may be updated based on patient status, additional functional criteria and insurance authorization.    Swallow Study   General Date of Onset: 05/07/23 HPI: Kenneth [name] is a 77 year old gentleman with past history of hypertension, psoriasis, former longtime smoker who quit several months ago, former heavy alcohol drinker, quit months ago, who lives alone and is followed by Dr. Felecia Shelling for primary care.  Patient was discovered by son earlier today with difficulty walking and a facial droop and weakness on the left upper and lower extremities.  He was last known to be well 2 weeks ago.  He was sent to see his primary care physician today and subsequently sent to the emergency department for further evaluation and concern for stroke.  Patient reportedly had been started on [employer] monthly injections for treatment of  a severe case of psoriasis by a dermatologist.  He has noticed improvement since starting this treatment.    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 Remote infarcts  and background chronic small-vessel ischemic  change" BSE and SLE requested. Type of Study: Bedside Swallow Evaluation Previous Swallow Assessment: none in chart Diet Prior to this Study: Regular Temperature Spikes Noted: No Respiratory Status: Room air History of Recent Intubation: No Behavior/Cognition: Alert;Cooperative;Pleasant mood Oral Cavity Assessment: Within Functional Limits Oral Care Completed by SLP: Recent completion by staff Oral Cavity - Dentition: Edentulous Vision: Functional for self-feeding Self-Feeding Abilities: Able to feed self Patient Positioning: Upright in chair Baseline Vocal Quality: Normal Volitional Cough: Strong Volitional Swallow: Able to elicit    Oral/Motor/Sensory Function Overall Oral Motor/Sensory Function: Within functional limits   Ice Chips Ice chips: Within functional limits   Thin Liquid Thin Liquid: Within functional limits    Nectar Thick Nectar Thick Liquid: Not tested   Honey Thick Honey Thick Liquid: Not tested   Puree Puree: Within functional limits   Solid     Solid: Impaired Presentation: Self Fed Other Comments:  (Impulsive however Pt seemed to manage without difficulty)      Aslee Such H. Romie Levee, CCC-SLP Speech Language Pathologist    Georgetta Haber 05/08/2023,2:08 PM

## 2023-05-08 NOTE — Plan of Care (Signed)
  Problem: Nutrition: Goal: Adequate nutrition will be maintained 05/08/2023 0609 by Jeanann Lewandowsky, RN Outcome: Progressing 05/07/2023 2311 by Jeanann Lewandowsky, RN Outcome: Progressing   Problem: Coping: Goal: Level of anxiety will decrease Outcome: Progressing

## 2023-05-08 NOTE — Evaluation (Signed)
Occupational Therapy Evaluation Patient Details Name: Kenneth [name] MRN: [id number] DOB: October 05, 1946 Today's Date: 05/08/2023   History of Present Illness Tejon Gracie is a 77 year old gentleman with past history of hypertension, psoriasis, former longtime smoker who quit several months ago, former heavy alcohol drinker, quit months ago, who lives alone and is followed by Dr. Felecia Shelling for primary care.  Patient was discovered by son earlier today with difficulty walking and a facial droop and weakness on the left upper and lower extremities.  He was last known to be well 2 weeks ago.  He was sent to see his primary care physician today and subsequently sent to the emergency department for further evaluation and concern for stroke.  Patient reportedly had been started on Skyrizi pen monthly injections for treatment of a severe case of psoriasis by a dermatologist.  He has noticed improvement since starting this treatment. (per MD)   Clinical Impression   Pt agreeable to OT and PT co-evaluation. Pt is reportedly independent at baseline with no AD for ambulation. Today pt demonstrates L hemiparesis with need of moderate assist for bed mobility and transfers with use of RW. Pt also presents with possibly L visual field cut and decreased sensation in L UE. Max A needed for lower body dressing. Pt is requiring much assist at this time. Pt left in transport chair with hospital staff. Pt will benefit from continued OT in the hospital and recommended venue below to increase strength, balance, and endurance for safe ADL's.         Recommendations for follow up therapy are one component of a multi-disciplinary discharge planning process, led by the attending physician.  Recommendations may be updated based on patient status, additional functional criteria and insurance authorization.   Assistance Recommended at Discharge Intermittent Supervision/Assistance  Patient can return home with the following A lot of help  with walking and/or transfers;A lot of help with bathing/dressing/bathroom;Assistance with cooking/housework;Assistance with feeding;Assist for transportation;Help with stairs or ramp for entrance    Functional Status Assessment  Patient has had a recent decline in their functional status and demonstrates the ability to make significant improvements in function in a reasonable and predictable amount of time.  Equipment Recommendations  None recommended by OT           Precautions / Restrictions Precautions Precautions: Fall Restrictions Weight Bearing Restrictions: No      Mobility Bed Mobility Overal bed mobility: Needs Assistance Bed Mobility: Supine to Sit     Supine to sit: Mod assist     General bed mobility comments: Assit to pull to sit. Difficulty moving L LE to EOB.    Transfers Overall transfer level: Needs assistance Equipment used: Rolling walker (2 wheels) Transfers: Sit to/from Stand, Bed to chair/wheelchair/BSC Sit to Stand: Mod assist     Step pivot transfers: Mod assist     General transfer comment: Slow labored movement; cuing to manage RW.      Balance Overall balance assessment: Needs assistance Sitting-balance support: No upper extremity supported, Feet supported Sitting balance-Leahy Scale: Fair Sitting balance - Comments: seated at EOB   Standing balance support: Bilateral upper extremity supported, During functional activity, Reliant on assistive device for balance Standing balance-Leahy Scale: Poor Standing balance comment: using RW                           ADL either performed or assessed with clinical judgement   ADL Overall ADL's : Needs assistance/impaired Eating/Feeding:  Minimal assistance;Set up;Sitting   Grooming: Moderate assistance;Sitting   Upper Body Bathing: Maximal assistance;Sitting;Moderate assistance   Lower Body Bathing: Maximal assistance;Sitting/lateral leans   Upper Body Dressing : Moderate  assistance;Maximal assistance;Sitting   Lower Body Dressing: Maximal assistance;Sitting/lateral leans   Toilet Transfer: Moderate assistance;Rolling walker (2 wheels);Stand-pivot Statistician Details (indicate cue type and reason): Simulated via EOB to chair transfer. Toileting- Clothing Manipulation and Hygiene: Maximal assistance;Bed level   Tub/ Shower Transfer: Moderate assistance;Maximal assistance;Rolling walker (2 wheels)   Functional mobility during ADLs: Moderate assistance;Rolling walker (2 wheels) General ADL Comments: Pt able to ambulate ~4 steps forward and backwards using RW with mod A.     Vision Baseline Vision/History:  (L eye blurry vision for a couple  months.) Ability to See in Adequate Light: 1 Impaired Patient Visual Report: Blurring of vision (Reports blurry vision at this time.) Vision Assessment?: Yes Eye Alignment:  (L eyelid droop) Tracking/Visual Pursuits: Other (comment) (Poor tracking in all directions today. Gaze mostly staying at midline.) Visual Fields:  (Possible L side visual field cut.)                Pertinent Vitals/Pain Pain Assessment Pain Assessment: No/denies pain     Hand Dominance Right   Extremity/Trunk Assessment Upper Extremity Assessment Upper Extremity Assessment: LUE deficits/detail LUE Deficits / Details: 2-/5 shoulder flexion; 2-/5 elbow flexion; 2-/5 elbow extension; 3+/5 wrist extension; 1/5 supination; 1/5 wrist flexion. 3+/5 composit grip. Moderate tone in shoulder and elbow. LUE Sensation: decreased light touch LUE Coordination: decreased fine motor;decreased gross motor   Lower Extremity Assessment Lower Extremity Assessment: Defer to PT evaluation   Cervical / Trunk Assessment Cervical / Trunk Assessment: Kyphotic   Communication Communication Communication: No difficulties   Cognition Arousal/Alertness: Awake/alert Behavior During Therapy: WFL for tasks assessed/performed Overall Cognitive Status:  Within Functional Limits for tasks assessed                                                        Home Living Family/patient expects to be discharged to:: Private residence Living Arrangements: Alone Available Help at Discharge: Family;Available PRN/intermittently Type of Home: House Home Access: Level entry     Home Layout: One level     Bathroom Shower/Tub: Chief Strategy Officer: Standard Bathroom Accessibility: Yes How Accessible: Accessible via walker Home Equipment: Rolling Walker (2 wheels);Cane - single point          Prior Functioning/Environment Prior Level of Function : Independent/Modified Independent             Mobility Comments: Tourist information centre manager without AD ADLs Comments: Independent        OT Problem List: Decreased strength;Decreased range of motion;Decreased activity tolerance;Impaired balance (sitting and/or standing);Impaired vision/perception;Decreased coordination;Impaired sensation;Impaired tone;Impaired UE functional use;Increased edema      OT Treatment/Interventions: Self-care/ADL training;Therapeutic exercise;DME and/or AE instruction;Neuromuscular education;Therapeutic activities;Patient/family education;Balance training;Visual/perceptual remediation/compensation    OT Goals(Current goals can be found in the care plan section) Acute Rehab OT Goals Patient Stated Goal: Open to rehab to improve function. OT Goal Formulation: With patient Time For Goal Achievement: 05/22/23 Potential to Achieve Goals: Good  OT Frequency: Min 2X/week    Co-evaluation PT/OT/SLP Co-Evaluation/Treatment: Yes Reason for Co-Treatment: To address functional/ADL transfers   OT goals addressed during session: ADL's and self-care  AM-PAC OT "6 Clicks" Daily Activity     Outcome Measure Help from another person eating meals?: A Little Help from another person taking care of personal grooming?: A Lot Help from another  person toileting, which includes using toliet, bedpan, or urinal?: A Lot Help from another person bathing (including washing, rinsing, drying)?: A Lot Help from another person to put on and taking off regular upper body clothing?: A Lot Help from another person to put on and taking off regular lower body clothing?: A Lot 6 Click Score: 13   End of Session Equipment Utilized During Treatment: Rolling walker (2 wheels)  Activity Tolerance: Patient tolerated treatment well Patient left: Other (comment) (In transport chair being taken to MRI)  OT Visit Diagnosis: Unsteadiness on feet (R26.81);Other abnormalities of gait and mobility (R26.89);History of falling (Z91.81);Ataxia, unspecified (R27.0);Other symptoms and signs involving the nervous system (R29.898);Hemiplegia and hemiparesis Hemiplegia - Right/Left: Left Hemiplegia - dominant/non-dominant: Non-Dominant Hemiplegia - caused by: Cerebral infarction                Time: 1610-9604 OT Time Calculation (min): 25 min Charges:  OT General Charges $OT Visit: 1 Visit OT Evaluation $OT Eval Low Complexity: 1 Low  Margene Cherian OT, MOT  Danie Chandler 05/08/2023, 9:16 AM

## 2023-05-08 NOTE — Progress Notes (Signed)
Transition of Care [facility]) - Inpatient Brief Assessment   Patient Details  Name: Kenneth [name] MRN: [id number] Date of Birth: November 21, 1945  [facility]) CM/SW Contact:    Annice Needy, LCSW Phone Number: 05/08/2023, 1:13 PM   Clinical Narrative: Transition of Care Department [facility]) has reviewed patient and no TOC needs have been identified at this time. We will continue to monitor patient advancement through interdisciplinary progression rounds. If new patient transition needs arise, please place a TOC consult.   Transition of Care Asessment:

## 2023-05-08 NOTE — Progress Notes (Signed)
Inpatient Rehabilitation Admissions Coordinator   Rehab consult received. Noted plans to transfer to [facility] for neurosurgery consultation. I will assess patient onsite at [facility].  Ottie Glazier, RN, MSN Rehab Admissions Coordinator [phone] 05/08/2023 1:48 PM

## 2023-05-08 NOTE — Evaluation (Signed)
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/2023   Former smoker 05/07/2023   Former alcohol abuser 05/07/2023   Lives alone 05/07/2023   Gait instability 05/07/2023   Left-sided weakness 05/07/2023   Failure to thrive in adult 05/07/2023   Facial droop 05/07/2023   Lower extremity edema 12/13/2022   Skin rash 12/13/2022   Past Medical History:  Past Medical History:  Diagnosis Date   Hypertension    Psoriasis    Past Surgical History:  Past Surgical History:  Procedure Laterality Date   HERNIA REPAIR     HPI:  Kenneth [name] is a 77 year old gentleman with past history of hypertension, psoriasis, former longtime smoker who quit several months ago, former heavy alcohol drinker, quit months ago, who lives alone and is followed by Dr. Felecia Shelling for primary care.  Patient was discovered by son earlier today with difficulty walking and a facial droop and weakness on the left upper and lower extremities.  He was last known to be well 2 weeks ago.  He was sent to see his primary care physician today and subsequently sent to the emergency department for further evaluation and concern for stroke.  Patient reportedly had been started on Skyrizi pen monthly injections for treatment of a severe case of psoriasis by a dermatologist.  He has noticed improvement since starting this treatment.    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 Remote infarcts and background chronic small-vessel ischemic  change" BSE and SLE requested.   Assessment / Plan / Recommendation Clinical Impression  Speech language evaluation completed; informal screen as  Pt's speech, language and memory are functioning near baseline, however Pt seems to mask some possible memory impairment with joking and dry sarcasm. Pt demonstrated recall of 3/4 objects with a brief delay and he recalled majority of detail from the neuro consult earlier today despite being in and out of sleep during the consult secondary to Ativan earlier today. Pt is oriented X4 and speech/language are intact. Pt may benefit from a more in depth SLE after d/c by [facility] or SNF SLP as Pt does live alone and reports being largely independent. There are no further ST needs for cognition/speech while in acute care, all further needs can be met at next venue, ST will sign off. Thank you for this referral,    SLP Assessment  SLP Recommendation/Assessment: All further Speech Lanaguage Pathology  needs can be addressed in the next venue of care SLP Visit Diagnosis: Cognitive communication deficit (R41.841)    Recommendations for follow up therapy are one component of a multi-disciplinary discharge planning process, led by the attending physician.  Recommendations may be updated based on patient status, additional functional criteria and insurance authorization.    Follow Up Recommendations  Home health SLP    Assistance Recommended at Discharge  None           SLP Evaluation Cognition  Overall Cognitive Status: Impaired/Different from baseline Arousal/Alertness: Awake/alert Orientation Level: Oriented X4 Memory: Impaired Memory Impairment: Decreased short term memory Decreased Short Term Memory: Verbal basic       Comprehension  Auditory Comprehension Overall Auditory Comprehension: Appears within functional limits for tasks assessed  Expression Expression Primary Mode of Expression: Verbal Verbal Expression Overall Verbal Expression: Appears within functional limits for tasks assessed Written Expression Dominant Hand: Right   Oral / Motor  Oral Motor/Sensory Function Overall Oral  Motor/Sensory Function: Within functional limits Motor Speech Overall Motor Speech: Appears within functional limits for tasks assessed            Jonatan Wilsey H. Romie Levee, CCC-SLP Speech Language Pathologist   Georgetta Haber 05/08/2023, 2:30 PM

## 2023-05-08 NOTE — Progress Notes (Signed)
tele called stating patient went into svt in the 150s for a second and is back down in the 70s now. speech therapy is in the room doing a swallow eval and patient is eating. Patient was asymptomatic. MD Tat made aware.

## 2023-05-08 NOTE — Progress Notes (Signed)
Report has been given to [facility]. 

## 2023-05-08 NOTE — Evaluation (Signed)
Physical Therapy Evaluation Patient Details Name: Kenneth [name] MRN: [id number] DOB: May 21, 1946 Today's Date: 05/08/2023  History of Present Illness  Kenneth [name] is a 77 year old gentleman with past history of hypertension, psoriasis, former longtime smoker who quit several months ago, former heavy alcohol drinker, quit months ago, who lives alone and is followed by Dr. Felecia Shelling for primary care.  Patient was discovered by son earlier today with difficulty walking and a facial droop and weakness on the left upper and lower extremities.  He was last known to be well 2 weeks ago.  He was sent to see his primary care physician today and subsequently sent to the emergency department for further evaluation and concern for stroke.  Patient reportedly had been started on Skyrizi pen monthly injections for treatment of a severe case of psoriasis by a dermatologist.  He has noticed improvement since starting this treatment.   Clinical Impression  Patient demonstrates slow labored movement for sitting up at bedside with difficulty propping up on elbows to hands due to LUE weakness, once seated had frequent posterior leaning that improvement after a few minutes, decreased bilateral ankle dorsiflexion, left worse than right, swelling of legs noted possibly limiting ankle dorsiflexion, and very unsteady on feet with frequent buckling of LLE due to weakness.  Patient tolerated sitting up in chair after therapy.  Patient will benefit from continued skilled physical therapy in hospital and recommended venue below to increase strength, balance, endurance for safe ADLs and gait.          Recommendations for follow up therapy are one component of a multi-disciplinary discharge planning process, led by the attending physician.  Recommendations may be updated based on patient status, additional functional criteria and insurance authorization.  Follow Up Recommendations       Assistance Recommended at Discharge Set up  Supervision/Assistance  Patient can return home with the following  A lot of help with bathing/dressing/bathroom;A lot of help with walking and/or transfers;Help with stairs or ramp for entrance;Assistance with cooking/housework    Equipment Recommendations None recommended by PT  Recommendations for Other Services       Functional Status Assessment Patient has had a recent decline in their functional status and demonstrates the ability to make significant improvements in function in a reasonable and predictable amount of time.     Precautions / Restrictions Precautions Precautions: Fall Restrictions Weight Bearing Restrictions: No      Mobility  Bed Mobility Overal bed mobility: Needs Assistance Bed Mobility: Supine to Sit     Supine to sit: Mod assist     General bed mobility comments: slow labored movement with limited use of left side due to weakness    Transfers Overall transfer level: Needs assistance Equipment used: Rolling walker (2 wheels) Transfers: Sit to/from Stand, Bed to chair/wheelchair/BSC Sit to Stand: Mod assist   Step pivot transfers: Mod assist       General transfer comment: unsteady labored movement with frequent buckling of LLE due to weakness    Ambulation/Gait Ambulation/Gait assistance: Mod assist, Max assist Gait Distance (Feet): 8 Feet Assistive device: Rolling walker (2 wheels) Gait Pattern/deviations: Decreased step length - right, Decreased step length - left, Decreased stride length, Decreased dorsiflexion - left, Decreased dorsiflexion - right, Antalgic, Knees buckling Gait velocity: slow     General Gait Details: limited to a few side steps and steps forward/backward due to buckling of LLE, limited bilateral ankle dorsiflexion and generalized weakness  Stairs  Wheelchair Mobility    Modified Rankin (Stroke Patients Only)       Balance Overall balance assessment: Needs assistance Sitting-balance support:  Feet supported, No upper extremity supported Sitting balance-Leahy Scale: Fair Sitting balance - Comments: seated at EOB   Standing balance support: Bilateral upper extremity supported, During functional activity, Reliant on assistive device for balance Standing balance-Leahy Scale: Poor Standing balance comment: using RW                             Pertinent Vitals/Pain Pain Assessment Pain Assessment: No/denies pain    Home Living Family/patient expects to be discharged to:: Private residence Living Arrangements: Alone Available Help at Discharge: Family;Available PRN/intermittently Type of Home: House Home Access: Level entry       Home Layout: One level Home Equipment: Agricultural consultant (2 wheels);Cane - single point      Prior Function Prior Level of Function : Independent/Modified Independent             Mobility Comments: Tourist information centre manager without AD ADLs Comments: Independent     Hand Dominance   Dominant Hand: Right    Extremity/Trunk Assessment   Upper Extremity Assessment Upper Extremity Assessment: Defer to OT evaluation LUE Deficits / Details: 2-/5 shoulder flexion; 2-/5 elbow flexion; 2-/5 elbow extension; 3+/5 wrist extension; 1/5 supination; 1/5 wrist flexion. 3+/5 composit grip. Moderate tone in shoulder and elbow. LUE Sensation: decreased light touch LUE Coordination: decreased fine motor;decreased gross motor    Lower Extremity Assessment Lower Extremity Assessment: Generalized weakness;RLE deficits/detail;LLE deficits/detail RLE Deficits / Details: grossly -4/5, except ankle dorsiflexion 3/5 limited mostly due to leg swelling RLE Sensation: decreased light touch RLE Coordination: decreased fine motor LLE Deficits / Details: grossly -3/5, except ankle dorsiflexion 2/5, leg swollen LLE Sensation: decreased light touch;decreased proprioception LLE Coordination: decreased fine motor;decreased gross motor    Cervical / Trunk  Assessment Cervical / Trunk Assessment: Kyphotic  Communication   Communication: No difficulties  Cognition Arousal/Alertness: Awake/alert Behavior During Therapy: WFL for tasks assessed/performed Overall Cognitive Status: Within Functional Limits for tasks assessed                                          General Comments      Exercises     Assessment/Plan    PT Assessment Patient needs continued PT services  PT Problem List Decreased strength;Decreased activity tolerance;Decreased balance;Decreased mobility;Impaired sensation       PT Treatment Interventions DME instruction;Gait training;Stair training;Functional mobility training;Therapeutic activities;Therapeutic exercise;Patient/family education;Balance training    PT Goals (Current goals can be found in the Care Plan section)  Acute Rehab PT Goals Patient Stated Goal: return home after rehab PT Goal Formulation: With patient Time For Goal Achievement: 05/22/23 Potential to Achieve Goals: Good    Frequency Min 4X/week     Co-evaluation PT/OT/SLP Co-Evaluation/Treatment: Yes Reason for Co-Treatment: To address functional/ADL transfers PT goals addressed during session: Mobility/safety with mobility;Balance;Proper use of DME OT goals addressed during session: ADL's and self-care       AM-PAC PT "6 Clicks" Mobility  Outcome Measure Help needed turning from your back to your side while in a flat bed without using bedrails?: A Lot Help needed moving from lying on your back to sitting on the side of a flat bed without using bedrails?: A Lot Help needed moving to and from  a bed to a chair (including a wheelchair)?: A Lot Help needed standing up from a chair using your arms (e.g., wheelchair or bedside chair)?: A Lot Help needed to walk in hospital room?: A Lot Help needed climbing 3-5 steps with a railing? : Total 6 Click Score: 11    End of Session   Activity Tolerance: Patient tolerated  treatment well;Patient limited by fatigue Patient left: in chair;with call bell/phone within reach Nurse Communication: Mobility status PT Visit Diagnosis: Unsteadiness on feet (R26.81);Other abnormalities of gait and mobility (R26.89);Muscle weakness (generalized) (M62.81)    Time: 1610-9604 PT Time Calculation (min) (ACUTE ONLY): 31 min   Charges:   PT Evaluation $PT Eval Moderate Complexity: 1 Mod PT Treatments $Therapeutic Activity: 23-37 mins        11:56 AM, 05/08/23 Ocie Bob, MPT Physical Therapist with [facility] 336 [phone] office [phone] mobile phone

## 2023-05-08 NOTE — Consult Note (Signed)
Reason for Consult:cervical myelopathy Referring Physician: tat, Bayard Kenneth is an 77 y.o. [name].  HPI: admitted for what was believed to be a stroke. Initially his story was that he had acute weakness in the left upper and lower extremity. Stroke workup only revealed small cerebellar infarct. However the brain MRI strongly suggested cervical stenosis. I was called this morning and recommended a repeat study since the first study was poor due to movement. The repeat study was actually worse.  It was also noted that his history left out pertinent details. His weakness could be dated back at least two weeks. His brother recalls having to pick Kenneth [name] up and carrying him to his car to take him to an appointment. 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   Past Medical History:  Diagnosis Date   Hypertension    Psoriasis     Past Surgical History:  Procedure Laterality Date   HERNIA REPAIR      History reviewed. No pertinent family history.  Social History:  reports that he has quit smoking. His smoking use included cigarettes. He has never used smokeless tobacco. He reports current alcohol use. He reports that he does not currently use drugs.  Allergies: No Known Allergies  Medications: I have reviewed the patient's current medications.  Results for orders placed or performed during the hospital encounter of 05/07/23 (from the past 48 hour(s))  Protime-INR     Status: None   Collection Time: 05/07/23  1:08 PM  Result Value Ref Range   Prothrombin Time 14.2 11.4 - 15.2 seconds   INR 1.1 0.8 - 1.2    Comment: (NOTE) INR goal varies based on device and disease states. Performed at [facility], [address], [place], [place] [address]   APTT      Status: None   Collection Time: 05/07/23  1:08 PM  Result Value Ref Range   aPTT 29 24 - 36 seconds    Comment: Performed at [facility], [address], [place], [place] [address]  CBC     Status: Abnormal   Collection Time: 05/07/23  1:08 PM  Result Value Ref Range   WBC 5.9 4.0 - 10.5 K/uL   RBC 4.56 4.22 - 5.81 MIL/uL   Hemoglobin 12.9 (L) 13.0 - 17.0 g/dL   HCT 46.9 62.9 - 52.8 %   MCV 90.6 80.0 - 100.0 fL   MCH 28.3 26.0 - 34.0 pg   MCHC 31.2 30.0 - 36.0 g/dL   RDW 41.3 24.4 - 01.0 %   Platelets 289 150 - 400 K/uL   nRBC 0.0 0.0 - 0.2 %    Comment: Performed at [facility], [address], [place], [place] [address]  Comprehensive metabolic panel     Status: Abnormal   Collection Time: 05/07/23  1:08 PM  Result Value Ref Range   Sodium 137 135 - 145 mmol/L   Potassium 4.0 3.5 - 5.1 mmol/L   Chloride 104 98 - 111 mmol/L   CO2 26 22 - 32 mmol/L   Glucose, Bld 113 (H) 70 - 99 mg/dL    Comment: Glucose reference range applies only to samples taken after fasting for at least 8 hours.   BUN 16 8 - 23 mg/dL   Creatinine, Ser 6.64 0.61 - 1.24 mg/dL   Calcium  8.9 8.9 - 10.3 mg/dL   Total Protein 7.3 6.5 - 8.1 g/dL   Albumin 3.6 3.5 - 5.0 g/dL   AST 25 15 - 41 U/L   ALT 16 0 - 44 U/L   Alkaline Phosphatase 58 38 - 126 U/L   Total Bilirubin 1.0 0.3 - 1.2 mg/dL   GFR, Estimated >84 >13 mL/min    Comment: (NOTE) Calculated using the CKD-EPI Creatinine Equation (2021)    Anion gap 7 5 - 15    Comment: Performed at [facility], [address] [place], [place] [address]  Ethanol     Status: None   Collection Time: 05/07/23  1:08 PM  Result Value Ref Range   Alcohol, Ethyl (B) <10 <10 mg/dL    Comment: (NOTE) Lowest detectable limit for serum alcohol is 10 mg/dL.  For medical purposes only. Performed at [facility], [address] [place], [place] [address]   Differential     Status: Abnormal   Collection Time: 05/07/23  1:08 PM  Result Value Ref Range   Neutrophils  Relative % 54 %   Neutro Abs 3.2 1.7 - 7.7 K/uL   Lymphocytes Relative 18 %   Lymphs Abs 1.1 0.7 - 4.0 K/uL   Monocytes Relative 6 %   Monocytes Absolute 0.3 0.1 - 1.0 K/uL   Eosinophils Relative 22 %   Eosinophils Absolute 1.3 (H) 0.0 - 0.5 K/uL   Basophils Relative 0 %   Basophils Absolute 0.0 0.0 - 0.1 K/uL   Immature Granulocytes 0 %   Abs Immature Granulocytes 0.02 0.00 - 0.07 K/uL    Comment: Performed at [facility], [address] [place], [place] [address]  I-STAT, [place] 8     Status: Abnormal   Collection Time: 05/07/23  1:17 PM  Result Value Ref Range   Sodium 141 135 - 145 mmol/L   Potassium 4.2 3.5 - 5.1 mmol/L   Chloride 107 98 - 111 mmol/L   BUN 15 8 - 23 mg/dL   Creatinine, Ser 0.34 0.61 - 1.24 mg/dL   Glucose, Bld 742 (H) 70 - 99 mg/dL    Comment: Glucose reference range applies only to samples taken after fasting for at least 8 hours.   Calcium, Ion 1.23 1.15 - 1.40 mmol/L   TCO2 26 22 - 32 mmol/L   Hemoglobin 14.3 13.0 - 17.0 g/dL   HCT 59.5 63.8 - 75.6 %  Lipid panel     Status: None   Collection Time: 05/07/23  3:01 PM  Result Value Ref Range   Cholesterol 129 0 - 200 mg/dL   Triglycerides 92 <433 mg/dL   HDL 47 >29 mg/dL   Total CHOL/HDL Ratio 2.7 RATIO   VLDL 18 0 - 40 mg/dL   LDL Cholesterol 64 0 - 99 mg/dL    Comment:        Total Cholesterol/HDL:CHD Risk Coronary Heart Disease Risk Table                     Men   Women  1/2 Average Risk   3.4   3.3  Average Risk       5.0   4.4  2 X Average Risk   9.6   7.1  3 X Average Risk  23.4   11.0        Use the calculated Patient Ratio above and the CHD Risk Table to determine the patient's CHD Risk.        ATP  III CLASSIFICATION (LDL):  <100     mg/dL   Optimal  782-956  mg/dL   Near or Above                    Optimal  130-159  mg/dL   Borderline  213-086  mg/dL   High  >578     mg/dL   Very High Performed at [facility], [address], [place], [place] [address]   TSH     Status: None    Collection Time: 05/07/23  3:01 PM  Result Value Ref Range   TSH 0.939 0.350 - 4.500 uIU/mL    Comment: Performed by a 3rd Generation assay with a functional sensitivity of <=0.01 uIU/mL. Performed at [facility], [address], [place], [place] [address]   VITAMIN D 25 Hydroxy (Vit-D Deficiency, Fractures)     Status: Abnormal   Collection Time: 05/07/23  3:01 PM  Result Value Ref Range   Vit D, 25-Hydroxy 16.96 (L) 30 - 100 ng/mL    Comment: (NOTE) Vitamin D deficiency has been defined by the Institute of Medicine  and an Endocrine Society practice guideline as a level of serum 25-OH  vitamin D less than 20 ng/mL (1,2). The Endocrine Society went on to  further define vitamin D insufficiency as a level between 21 and 29  ng/mL (2).  1. IOM (Institute of Medicine). 2010. Dietary reference intakes for  calcium and D. [place]: [employer]. 2. Holick MF, Binkley Patterson, Bischoff-Ferrari HA, et al. Evaluation,  treatment, and prevention of vitamin D deficiency: an Endocrine  Society clinical practice guideline, JCEM. 2011 Jul; 96(7): 1911-30.  Performed at [facility], [address], [place], [place] [address]   Vitamin B12     Status: None   Collection Time: 05/08/23  8:00 AM  Result Value Ref Range   Vitamin B-12 358 180 - 914 pg/mL    Comment: (NOTE) This assay is not validated for testing neonatal or myeloproliferative syndrome specimens for Vitamin B12 levels. Performed at [facility], [address], [place], [place] [address]   TSH     Status: None   Collection Time: 05/08/23  8:00 AM  Result Value Ref Range   TSH 0.829 0.350 - 4.500 uIU/mL    Comment: Performed by a 3rd Generation assay with a functional sensitivity of <=0.01 uIU/mL. Performed at [facility], [address], [place], [place] [address]   T4, free     Status: None   Collection Time: 05/08/23  8:00 AM  Result Value Ref Range   Free T4 1.06 0.61 - 1.12 ng/dL    Comment:  (NOTE) Biotin ingestion may interfere with free T4 tests. If the results are inconsistent with the TSH level, previous test results, or the clinical presentation, then consider biotin interference. If needed, order repeat testing after stopping biotin. Performed at [facility], [address] [place], [place] [address]   Folate     Status: None   Collection Time: 05/08/23  8:00 AM  Result Value Ref Range   Folate 8.1 >5.9 ng/mL    Comment: Performed at [facility], [address] [place], [place] [address]    CT ANGIO HEAD W OR WO CONTRAST  Result Date: 05/08/2023 CLINICAL DATA:  Stroke, follow up EXAM: CT ANGIOGRAPHY HEAD AND NECK TECHNIQUE: Multidetector CT imaging of the head and neck was performed using the standard protocol during bolus administration of intravenous contrast. Multiplanar  CT image reconstructions and MIPs were obtained to evaluate the vascular anatomy. Carotid stenosis measurements (when applicable) are obtained utilizing NASCET criteria, using the distal internal carotid diameter as the denominator. RADIATION DOSE REDUCTION: This exam was performed according to the departmental dose-optimization program which includes automated exposure control, adjustment of the mA and/or kV according to patient size and/or use of iterative reconstruction technique. CONTRAST:  75mL OMNIPAQUE IOHEXOL 350 MG/ML SOLN COMPARISON:  Same day MRI and CT head. FINDINGS: CTA NECK FINDINGS Aortic arch: Aortic atherosclerosis. Great vessel origins are patent without significant stenosis. Right carotid system: Atherosclerosis at the carotid bifurcation without greater than 50% stenosis. Left carotid system: Atherosclerosis at the carotid bifurcation without greater than 50% stenosis. Vertebral arteries: Severe stenosis of the right vertebral artery origin. Mild left vertebral artery origin stenosis. Patent bilaterally. Skeleton: Severe degenerative change in the cervical spine. No acute  abnormality on limited assessment. Other neck: No acute abnormality on limited assessment. Upper chest: Emphysema. Review of the MIP images confirms the above findings CTA HEAD FINDINGS Anterior circulation: Bilateral intracranial ICAs, MCAs, and ACAs are patent without proximal hemodynamically significant stenosis. 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 The anterior communicating artery is not well visualized. Posterior circulation: Bilateral intradural vertebral arteries, basilar artery and bilateral posterior cerebral arteries are patent without proximal hemodynamically significant stenosis. Small Venous sinuses: As permitted by contrast timing, patent. Review of the MIP images confirms the above findings IMPRESSION: 1. No emergent large vessel occlusion 2. Severe right vertebral artery origin stenosis. 3. 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 4. Aortic Atherosclerosis (ICD10-I70.0) and Emphysema (ICD10-J43.9). Electronically Signed   By: Feliberto Harts M.D.   On: 05/08/2023 15:33   MR CERVICAL SPINE WO CONTRAST  Result Date: 05/08/2023 CLINICAL DATA:  77 year old [name] with myelopathy. Evidence of cord compression on motion degraded cervical spine MRI yesterday. Repeat attempt with IV medication. EXAM: MRI CERVICAL SPINE WITHOUT CONTRAST TECHNIQUE: Multiplanar, multisequence MR imaging of the cervical spine was performed. No intravenous contrast was administered. COMPARISON:  Brain and cervical spine MRI 05/07/2023. FINDINGS: Despite IV medication and repeated imaging attempts this exam remains moderately degraded by motion. Diffuse cervical spine spondylosis with severe disc space loss and bulky endplate spurring throughout results in spinal stenosis with spinal cord mass effect from C3-C4  through C6-C7. Evidence of associated spinal cord myelomalacia which is likely most pronounced at the C4-C5 level (series 5, image 7). Furthermore, abnormal mild prevertebral fluid or edema is redemonstrated from C2-C3 through C5-C6 (confirmed on axial series 8, image 13). This is nonspecific. No evidence of underlying discitis. Patchy marrow edema in the cervical spine appears to be degenerative in nature. IMPRESSION: 1. Little improved diagnostic image quality despite repeated imaging attempts with IV medication today. 2. Widespread advanced cervical spine degeneration and spinal stenosis C3-C4 through C6-C7 with spinal cord mass effect at each level. 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 3. Abnormal but nonspecific prevertebral edema or fluid from the C2 to the C6 level. If there has been recent fall/trauma then consider anterior ligamentous injury and in that case recommend noncontrast cervical spine CT which would be most sensitive for cervical vertebral fracture. Electronically Signed   By: Odessa Fleming M.D.   On: 05/08/2023 10:40   MR CERVICAL SPINE WO CONTRAST  Result Date:  05/07/2023 CLINICAL DATA:  Neck pain, ataxia EXAM: MRI CERVICAL SPINE WITHOUT CONTRAST TECHNIQUE: Multiplanar, multisequence MR imaging of the cervical spine was performed. No intravenous contrast was administered. COMPARISON:  None Available. FINDINGS: Evaluation is significantly limited by motion artifact. Limited utility of the sagittal sequences. The axial sequences are nearly nondiagnostic. Alignment: Straightening of the normal cervical lordosis. Vertebrae: No acute fracture, evidence of discitis, or suspicious osseous lesion. Congenitally short pedicles, which narrow the AP diameter of the spinal canal. Cord: Suspect increased T2 signal in the spinal cord at C3-C4 and C4-C5 (series 7, image 8), although this cannot be corroborated on the axial images. Posterior Fossa, vertebral arteries,  paraspinal tissues: Trace prevertebral fluid. Disc levels: Motion precludes evaluation of the neural foramina, although there appears to the significant facet and uncovertebral hypertrophy, likely resulting in advanced neural foraminal narrowing at most cervical levels. C2-C3: No significant disc bulge. Mild to moderate spinal canal stenosis. C3-C4: Disc height loss and moderate disc osteophyte complex. Severe spinal canal stenosis. C4-C5: Disc height loss and disc osteophyte complex. Severe spinal canal stenosis. C5-C6: Disc height loss and disc osteophyte complex. Moderate to severe spinal canal stenosis. C6-C7: Disc height loss and mild disc osteophyte complex. Moderate spinal canal stenosis. C7-T1: Mild disc bulge.  Mild spinal canal stenosis. T1-T2: Disc height loss and mild disc osteophyte complex. Mild spinal canal stenosis. IMPRESSION: 1. Evaluation is significantly limited by motion artifact, with limited utility of the sagittal sequences and nearly nondiagnostic axial sequences. Consider repeating examine the patient is better able to tolerate it. 2. Congenitally short pedicles, which narrow the AP diameter of the spinal canal, with superimposed degenerative disc disease, which causes severe spinal canal stenosis at C3-C4 and C4-C5, moderate to severe spinal canal stenosis at C5-C6, and moderate spinal canal stenosis at C6-C7. 3. Suspect increased T2 signal in the spinal cord at C3-C4 and C4-C5, although this cannot be corroborated on the axial images due to motion. 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 4. Motion precludes evaluation of the neural foramina, although there appears to the significant facet and uncovertebral hypertrophy, likely resulting in advanced neural foraminal narrowing at most cervical levels. Attention on reimaging. Electronically Signed   By: Wiliam Ke M.D.   On: 05/07/2023 17:07   ECHOCARDIOGRAM  COMPLETE  Result Date: 05/07/2023    ECHOCARDIOGRAM REPORT   Patient Name:   Kenneth [name] Date of Exam: 05/07/2023 Medical Rec #:  [id number]     Height:       64.0 in Accession #:    [id number]    Weight:       140.0 lb Date of Birth:  03/17/1946     BSA:          1.681 m Patient Age:    77 years      BP:           110/76 mmHg Patient Gender: M             HR:           57 bpm. [facility] Procedure: 2D Echo, Cardiac Doppler and Color Doppler Indications:    Stroke I63.9  History:        Patient has no prior history of Echocardiogram examinations.                 Stroke; Risk Factors:Former Smoker. Former alcohol abuser.  Sonographer:    Celesta Gentile RCS Referring Phys:  [id number] [place] L JOHNSON IMPRESSIONS  1. Left ventricular ejection fraction, by estimation, is 55 to 60%. The left ventricle has normal function. The left ventricle has no regional wall motion abnormalities. There is mild left ventricular hypertrophy. Left ventricular diastolic parameters are consistent with Grade I diastolic dysfunction (impaired relaxation).  2. Right ventricular systolic function is normal. The right ventricular size is normal. Tricuspid regurgitation signal is inadequate for assessing PA pressure.  3. The mitral valve is normal in structure. Trivial mitral valve regurgitation. No evidence of mitral stenosis.  4. The aortic valve is tricuspid. Aortic valve regurgitation is not visualized. No aortic stenosis is present.  5. Aortic dilatation noted. There is borderline dilatation of the aortic root, measuring 36 mm.  6. The inferior vena cava is normal in size with greater than 50% respiratory variability, suggesting right atrial pressure of 3 mmHg. Comparison(s): No prior Echocardiogram. FINDINGS  Left Ventricle: Left ventricular ejection fraction, by estimation, is 55 to 60%. The left ventricle has normal function. The left ventricle has no regional wall motion abnormalities. The left ventricular internal cavity  size was normal in size. There is  mild left ventricular hypertrophy. Left ventricular diastolic parameters are consistent with Grade I diastolic dysfunction (impaired relaxation). Right Ventricle: The right ventricular size is normal. No increase in right ventricular wall thickness. Right ventricular systolic function is normal. Tricuspid regurgitation signal is inadequate for assessing PA pressure. Left Atrium: Left atrial size was normal in size. Right Atrium: Right atrial size was normal in size. Pericardium: There is no evidence of pericardial effusion. Mitral Valve: The mitral valve is normal in structure. Trivial mitral valve regurgitation. No evidence of mitral valve stenosis. Tricuspid Valve: The tricuspid valve is normal in structure. Tricuspid valve regurgitation is trivial. No evidence of tricuspid stenosis. Aortic Valve: The aortic valve is tricuspid. Aortic valve regurgitation is not visualized. No aortic stenosis is present. Pulmonic Valve: The pulmonic valve was normal in structure. Pulmonic valve regurgitation is trivial. No evidence of pulmonic stenosis. Aorta: Aortic dilatation noted. There is borderline dilatation of the aortic root, measuring 36 mm. Venous: The inferior vena cava is normal in size with greater than 50% respiratory variability, suggesting right atrial pressure of 3 mmHg. IAS/Shunts: No atrial level shunt detected by color flow Doppler.  LEFT VENTRICLE PLAX 2D LVIDd:         4.80 cm   Diastology LVIDs:         3.20 cm   LV e' medial:    6.85 cm/s LV PW:         1.30 cm   LV E/e' medial:  8.6 LV IVS:        1.00 cm   LV e' lateral:   10.60 cm/s LVOT diam:     2.00 cm   LV E/e' lateral: 5.6 LV SV:         57 LV SV Index:   34 LVOT Area:     3.14 cm  RIGHT VENTRICLE RV S prime:     15.40 cm/s TAPSE (M-mode): 2.3 cm LEFT ATRIUM             Index        RIGHT ATRIUM           Index LA diam:        2.90 cm 1.72 cm/m   RA Area:     15.00 cm LA Vol (A2C):   59.0 ml 35.09 ml/m  RA  Volume:   38.80 ml  23.08 ml/m LA Vol (A4C):   32.7 ml 19.45 ml/m LA Biplane Vol: 47.3 ml 28.13 ml/m  AORTIC VALVE LVOT Vmax:   84.60 cm/s LVOT Vmean:  53.100 cm/s LVOT VTI:    0.181 m  AORTA Ao Root diam: 3.60 cm MITRAL VALVE MV Area (PHT): 3.42 cm    SHUNTS MV Decel Time: 222 msec    Systemic VTI:  0.18 m MV E velocity: 59.10 cm/s  Systemic Diam: 2.00 cm MV A velocity: 69.20 cm/s MV E/A ratio:  0.85 Vishnu Priya Mallipeddi Electronically signed by Winfield Rast Mallipeddi Signature Date/Time: 05/07/2023/4:41:58 PM    Final    MR BRAIN WO CONTRAST  Result Date: 05/07/2023 CLINICAL DATA:  Neuro deficit, stroke suspected. EXAM: MRI HEAD WITHOUT CONTRAST TECHNIQUE: Multiplanar, multiecho pulse sequences of the brain and surrounding structures were obtained without intravenous contrast. COMPARISON:  Same-day CT head FINDINGS: Brain: There is a small focus of diffusion restriction in the left cerebellar hemisphere consistent with a small acute infarct. There is no hemorrhage or mass effect. There is no other evidence of acute infarct There is no acute intracranial hemorrhage or extra-axial fluid collection. Background parenchymal volume loss with prominence of the ventricular system and extra-axial CSF spaces is unchanged. Encephalomalacia and gliosis in the bilateral frontal lobes is again seen consistent with prior infarcts. There are multiple additional small remote infarcts in the bilateral cerebellar hemispheres. Additional patchy and confluent FLAIR signal abnormality in the supratentorial white matter is consistent with moderate underlying chronic small-vessel ischemic change. The pituitary and suprasellar region are normal. There is no mass lesion. There is no mass effect or midline shift. Vascular: Normal flow voids. Skull and upper cervical spine: There is no suspicious marrow signal abnormality. There is degenerative change of the upper cervical spine with multilevel severe spinal canal stenosis and  cord compression, incompletely evaluated. Sinuses/Orbits: The paranasal sinuses are clear. The globes and orbits are unremarkable. Other: There is trace fluid in the left mastoid tip. IMPRESSION: 1. 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 2. Remote infarcts and background chronic small-vessel ischemic change as above. 3. 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 Consider dedicated cervical spine MRI as indicated. Electronically Signed   By: Lesia Hausen M.D.   On: 05/07/2023 15:26   CT HEAD WO CONTRAST  Result Date: 05/07/2023 CLINICAL DATA:  Provided history: Neuro deficit, acute, stroke suspected. Left-sided weakness. Difficulty ambulating. Altered speech. Vision changes. Dizziness. Headache. EXAM: CT HEAD WITHOUT CONTRAST TECHNIQUE: Contiguous axial images were obtained from the base of the skull through the vertex without intravenous contrast. RADIATION DOSE REDUCTION: This exam was performed according to the departmental dose-optimization program which includes automated exposure control, adjustment of the mA and/or kV according to patient size and/or use of iterative reconstruction technique. COMPARISON:  Head CT 07/30/2010. FINDINGS: Brain: Redemonstrated foci of chronic encephalomalacia/gliosis within the anteromedial frontal lobes bilaterally, mid right frontal lobe and left frontal operculum/left insula consistent with chronic infarcts. Background patchy and ill-defined hypoattenuation within the cerebral white matter, nonspecific but compatible with moderate chronic small vessel ischemic disease. There is no acute intracranial hemorrhage. No acute demarcated cortical infarct. No extra-axial fluid collection. No evidence of an intracranial mass. No midline shift. Vascular: No hyperdense vessel.  Atherosclerotic calcifications. Skull: No fracture or aggressive osseous lesion.  Sinuses/Orbits: No mass or acute finding within the imaged orbits. Trace mucosal thickening within the right maxillary sinus at the imaged  levels (with associated chronic reactive osteitis). Small volume secretions within the right sphenoid sinus. IMPRESSION: 1.  No evidence of an acute intracranial abnormality. 2. 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 3. Background moderate chronic small vessel ischemic changes within the cerebral white matter. 4. Mild paranasal sinus disease at the imaged levels. Electronically Signed   By: Jackey Loge D.O.   On: 05/07/2023 13:01    Review of Systems  Constitutional:  Positive for activity change.  HENT: Negative.    Eyes: Negative.   Respiratory: Negative.    Cardiovascular: Negative.   Gastrointestinal: Negative.   Endocrine: Negative.   Genitourinary: Negative.   Musculoskeletal:  Positive for neck pain.  Skin: Negative.   Neurological:  Positive for weakness and numbness.  Hematological: Negative.   Psychiatric/Behavioral: Negative.     Blood pressure (!) 152/89, pulse 71, temperature (!) 97.5 F (36.4 C), temperature source Oral, resp. rate 20, height 5\' 4"  (1.626 m), weight 63.5 kg, SpO2 99 %. Physical Exam Constitutional:      Appearance: Normal appearance.  HENT:     Head: Normocephalic and atraumatic.     Right Ear: External ear normal.     Left Ear: External ear normal.     Nose: Nose normal.     Mouth/Throat:     Mouth: Mucous membranes are moist.     Pharynx: Oropharynx is clear.  Eyes:     Extraocular Movements: Extraocular movements intact.     Pupils: Pupils are equal, round, and reactive to light.  Cardiovascular:     Rate and Rhythm: Normal rate and regular rhythm.  Pulmonary:     Effort: Pulmonary effort is normal.  Abdominal:     General: Abdomen is flat.     Palpations: Abdomen is soft.   Musculoskeletal:        General: Normal range of motion.     Cervical back: Rigidity and tenderness present.  Skin:    General: Skin is warm and dry.  Neurological:     Mental Status: He is alert and oriented to person, place, and time. He is confused.     Cranial Nerves: Cranial nerves 2-12 are intact.     Sensory: Sensory deficit present.     Motor: Weakness present.     Coordination: Coordination abnormal.     Comments: Gait not assessed.  Great difficulty feeding himself due to poor hand strength and lack of dexterity Strength is quite good in both upper and lower extremities essentially 5/5 Decreased pin prick upper and lower extremities Hyper reflexic upper and lower extremities     Assessment/Plan: Kenneth [name] is a 77 y.o. [name] With cervical myelopathy. He needs an operative decompression to relieve the pressure on his spinal cord. I am looking at Friday or Saturday for the case.   Coletta Memos 05/08/2023, 7:30 PM

## 2023-05-08 NOTE — Care Management Obs Status (Signed)
MEDICARE OBSERVATION STATUS NOTIFICATION   Patient Details  Name: Kenneth [name] MRN: [id number] Date of Birth: September 25, 1946   Medicare Observation Status Notification Given:  Yes    Corey Harold 05/08/2023, 3:45 PM

## 2023-05-09 ENCOUNTER — Observation Stay (HOSPITAL_COMMUNITY): Payer: 59

## 2023-05-09 DIAGNOSIS — R627 Adult failure to thrive: Secondary | ICD-10-CM | POA: Diagnosis present

## 2023-05-09 DIAGNOSIS — G8194 Hemiplegia, unspecified affecting left nondominant side: Secondary | ICD-10-CM | POA: Diagnosis present

## 2023-05-09 DIAGNOSIS — I639 Cerebral infarction, unspecified: Secondary | ICD-10-CM | POA: Diagnosis present

## 2023-05-09 DIAGNOSIS — L409 Psoriasis, unspecified: Secondary | ICD-10-CM | POA: Diagnosis present

## 2023-05-09 DIAGNOSIS — R29703 NIHSS score 3: Secondary | ICD-10-CM | POA: Diagnosis present

## 2023-05-09 DIAGNOSIS — L98499 Non-pressure chronic ulcer of skin of other sites with unspecified severity: Secondary | ICD-10-CM | POA: Diagnosis not present

## 2023-05-09 DIAGNOSIS — I10 Essential (primary) hypertension: Secondary | ICD-10-CM | POA: Diagnosis present

## 2023-05-09 DIAGNOSIS — M5001 Cervical disc disorder with myelopathy,  high cervical region: Secondary | ICD-10-CM | POA: Diagnosis present

## 2023-05-09 DIAGNOSIS — R29810 Facial weakness: Secondary | ICD-10-CM | POA: Diagnosis present

## 2023-05-09 DIAGNOSIS — R64 Cachexia: Secondary | ICD-10-CM | POA: Diagnosis present

## 2023-05-09 DIAGNOSIS — R5381 Other malaise: Secondary | ICD-10-CM | POA: Diagnosis present

## 2023-05-09 DIAGNOSIS — E782 Mixed hyperlipidemia: Secondary | ICD-10-CM | POA: Diagnosis present

## 2023-05-09 DIAGNOSIS — R270 Ataxia, unspecified: Secondary | ICD-10-CM | POA: Diagnosis present

## 2023-05-09 DIAGNOSIS — M2578 Osteophyte, vertebrae: Secondary | ICD-10-CM | POA: Diagnosis present

## 2023-05-09 DIAGNOSIS — R296 Repeated falls: Secondary | ICD-10-CM | POA: Diagnosis present

## 2023-05-09 DIAGNOSIS — J441 Chronic obstructive pulmonary disease with (acute) exacerbation: Secondary | ICD-10-CM | POA: Diagnosis present

## 2023-05-09 DIAGNOSIS — M4802 Spinal stenosis, cervical region: Secondary | ICD-10-CM | POA: Diagnosis present

## 2023-05-09 DIAGNOSIS — Z87891 Personal history of nicotine dependence: Secondary | ICD-10-CM | POA: Diagnosis not present

## 2023-05-09 DIAGNOSIS — Z6824 Body mass index (BMI) 24.0-24.9, adult: Secondary | ICD-10-CM | POA: Diagnosis not present

## 2023-05-09 DIAGNOSIS — R600 Localized edema: Secondary | ICD-10-CM | POA: Diagnosis present

## 2023-05-09 DIAGNOSIS — L97519 Non-pressure chronic ulcer of other part of right foot with unspecified severity: Secondary | ICD-10-CM | POA: Diagnosis present

## 2023-05-09 DIAGNOSIS — I63542 Cerebral infarction due to unspecified occlusion or stenosis of left cerebellar artery: Secondary | ICD-10-CM | POA: Diagnosis present

## 2023-05-09 DIAGNOSIS — Z79899 Other long term (current) drug therapy: Secondary | ICD-10-CM | POA: Diagnosis not present

## 2023-05-09 DIAGNOSIS — Z8673 Personal history of transient ischemic attack (TIA), and cerebral infarction without residual deficits: Secondary | ICD-10-CM | POA: Diagnosis not present

## 2023-05-09 DIAGNOSIS — Z22322 Carrier or suspected carrier of Methicillin resistant Staphylococcus aureus: Secondary | ICD-10-CM | POA: Diagnosis not present

## 2023-05-09 LAB — VAS US ABI WITH/WO TBI
Left ABI: 1.25
Right ABI: 1.15

## 2023-05-09 LAB — HEMOGLOBIN A1C
Hgb A1c MFr Bld: 5.8 % — ABNORMAL HIGH (ref 4.8–5.6)
Mean Plasma Glucose: 120 mg/dL

## 2023-05-09 LAB — GLUCOSE, CAPILLARY: Glucose-Capillary: 116 mg/dL — ABNORMAL HIGH (ref 70–99)

## 2023-05-09 MED ORDER — B COMPLEX-C PO TABS
1.0000 | ORAL_TABLET | Freq: Every day | ORAL | Status: DC
  Administered 2023-05-09 – 2023-05-15 (×8): 1 via ORAL
  Filled 2023-05-09 (×7): qty 1

## 2023-05-09 MED ORDER — CYANOCOBALAMIN 1000 MCG/ML IJ SOLN
1000.0000 ug | Freq: Once | INTRAMUSCULAR | Status: AC
  Administered 2023-05-09: 1000 ug via SUBCUTANEOUS
  Filled 2023-05-09: qty 1

## 2023-05-09 MED ORDER — VITAMIN B-12 1000 MCG PO TABS
1000.0000 ug | ORAL_TABLET | Freq: Every day | ORAL | Status: DC
  Administered 2023-05-10 – 2023-05-15 (×6): 1000 ug via ORAL
  Filled 2023-05-09 (×6): qty 1

## 2023-05-09 MED ORDER — VITAMIN D (ERGOCALCIFEROL) 1.25 MG (50000 UNIT) PO CAPS
50000.0000 [IU] | ORAL_CAPSULE | ORAL | Status: DC
  Administered 2023-05-09: 50000 [IU] via ORAL
  Filled 2023-05-09: qty 1

## 2023-05-09 NOTE — Progress Notes (Signed)
Physical Therapy Treatment Patient Details Name: Kenneth [name] MRN: [id number] DOB: 08-21-1946 Today's Date: 05/09/2023   History of Present Illness Pt is a 77 y.o. male who presented to [facility] 05/07/23 with L-sided weakness and facial droop. 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 Pt transferred to [facility] 6/26. PMH: HTN, psoriasis    PT Comments    Pt is demonstrating gradual progress with functional mobility, only needing modA for bed mobility, transfers, and gait bouts and also progressing to ambulating an increased distance of up to ~25 ft today. He continues to display deficits in overall strength (L weaker than R with UE weaker than LE) and balance which place him at high risk for falls. He could greatly benefit from intensive inpatient rehab, >3 hours/day, as he was independent without AD PTA. Will continue to follow acutely.    Recommendations for follow up therapy are one component of a multi-disciplinary discharge planning process, led by the attending physician.  Recommendations may be updated based on patient status, additional functional criteria and insurance authorization.  Follow Up Recommendations       Assistance Recommended at Discharge Intermittent Supervision/Assistance  Patient can return home with the following A lot of help with walking and/or transfers;A lot of help with bathing/dressing/bathroom;Assistance with cooking/housework;Direct supervision/assist for medications management;Direct  supervision/assist for financial management;Assist for transportation   Equipment Recommendations  BSC/3in1    Recommendations for Other Services       Precautions / Restrictions Precautions Precautions: Fall Restrictions Weight Bearing Restrictions: No     Mobility  Bed Mobility Overal bed mobility: Needs Assistance Bed Mobility: Supine to Sit, Rolling Rolling: Min assist   Supine to sit: Mod assist, HOB elevated     General bed mobility comments: Cues to roll onto L elbow to push up from L arm to ascend trunk to sit L EOB, modA to complete.    Transfers Overall transfer level: Needs assistance Equipment used: Rolling walker (2 wheels) Transfers: Sit to/from Stand Sit to Stand: Mod assist           General transfer comment: Cues to scoot to edge, place feet posteriorly under him, place L hand on RW, and R hand on bed to push up to stand, modA.    Ambulation/Gait Ambulation/Gait assistance: Mod assist Gait Distance (Feet): 25 Feet (x2 bouts of ~14 ft > ~25 ft) Assistive device: Rolling walker (2 wheels) Gait Pattern/deviations: Decreased step length - right, Decreased step length - left, Decreased stride length, Decreased dorsiflexion - left, Decreased dorsiflexion - right, Knees buckling, Shuffle, Trunk flexed, Narrow base of support Gait velocity: reduced Gait velocity interpretation: <1.31 ft/sec, indicative of household ambulator   General Gait Details: Pt takes slow, small, shuffling steps with a very flexed posture and narrow stance. Verbal and tactile cues provided to stand upright and widen his BOS, mod momentary success noted. ModA for balance throughout.   Stairs             Wheelchair Mobility    Modified Rankin (Stroke Patients Only) Modified Rankin (Stroke Patients Only) Pre-Morbid Rankin  Score: No symptoms Modified Rankin: Moderately severe disability     Balance Overall balance assessment: Needs assistance Sitting-balance support:  Feet supported, No upper extremity supported Sitting balance-Leahy Scale: Fair     Standing balance support: Bilateral upper extremity supported, During functional activity, Reliant on assistive device for balance Standing balance-Leahy Scale: Poor Standing balance comment: using RW                            Cognition Arousal/Alertness: Awake/alert Behavior During Therapy: WFL for tasks assessed/performed Overall Cognitive Status: Within Functional Limits for tasks assessed                                 General Comments: Pt joking often throughout session with niece reporting he is acting more like himself, but did display questionable awareness, memory, and processing speed        Exercises      General Comments        Pertinent Vitals/Pain Pain Assessment Pain Assessment: Faces Faces Pain Scale: Hurts little more Pain Location: generalized with mobility Pain Descriptors / Indicators: Discomfort, Grimacing Pain Intervention(s): Limited activity within patient's tolerance, Monitored during session, Repositioned    Home Living                          Prior Function            PT Goals (current goals can now be found in the care plan section) Acute Rehab PT Goals Patient Stated Goal: to improve PT Goal Formulation: With patient/family Time For Goal Achievement: 05/22/23 Potential to Achieve Goals: Good Progress towards PT goals: Progressing toward goals    Frequency    Min 4X/week      PT Plan Equipment recommendations need to be updated    Co-evaluation              AM-PAC PT "6 Clicks" Mobility   Outcome Measure  Help needed turning from your back to your side while in a flat bed without using bedrails?: A Little Help needed moving from lying on your back to sitting on the side of a flat bed without using bedrails?: A Lot Help needed moving to and from a bed to a chair (including a wheelchair)?: A Lot Help  needed standing up from a chair using your arms (e.g., wheelchair or bedside chair)?: A Lot Help needed to walk in hospital room?: A Lot Help needed climbing 3-5 steps with a railing? : Total 6 Click Score: 12    End of Session Equipment Utilized During Treatment: Gait belt Activity Tolerance: Patient tolerated treatment well Patient left: in chair;with call bell/phone within reach;with chair alarm set;with family/visitor present Nurse Communication: Mobility status;Other (comment) (niece is a Charity fundraiser and reports she can transfer pt as needed, educated niece that if RN is ok with that then to use the bedside commode and not walk to bathroom) PT Visit Diagnosis: Unsteadiness on feet (R26.81);Other abnormalities of gait and mobility (R26.89);Muscle weakness (generalized) (M62.81);Difficulty in walking, not elsewhere classified (R26.2);Other symptoms and signs involving the nervous system (R29.898)     Time: 6578-4696 PT Time Calculation (min) (ACUTE ONLY): 28 min  Charges:  $Gait Training: 8-22 mins $Therapeutic Activity: 8-22 mins                     Kenneth [name], PT,  DPT Acute Rehabilitation Services  Office: [phone]    Kenneth [name] 05/09/2023, 3:31 PM

## 2023-05-09 NOTE — Progress Notes (Signed)
Patient ID: Kenneth [name], male   DOB: 13-Feb-1946, 77 y.o.   MRN: [id number] BP 129/67 (BP Location: Left Arm)   Pulse 66   Temp 97.8 F (36.6 C) (Oral)   Resp 18   Ht 5\' 4"  (1.626 m)   Wt 63.5 kg   SpO2 99%   BMI 24.03 kg/m  Alert and oriented x 4 Moving all extremities, coordination is very poor Weakness in upper and lower extremities Hoping for OR tomorrow. Npo after breakfast

## 2023-05-09 NOTE — TOC Initial Note (Signed)
Transition of Care [facility]) - Initial/Assessment Note    Patient Details  Name: Kenneth [name] MRN: [id number] Date of Birth: January 06, 1946  Transition of Care [facility]) CM/SW Contact:    Kermit Balo, RN Phone Number: 05/09/2023, 1:24 PM  Clinical Narrative:                 Pt is from home alone. He states he has siblings that he can stay with if needed after discharge.  Pt manages his own medications at home. He denies any issues. Pts brother provides needed transportation.  Plans if for surgery tomorrow vs Saturday.  CIR following for a potential rehab stay after surgery.  TOC following.  Expected Discharge Plan: IP Rehab Facility Barriers to Discharge: Continued Medical Work up   Patient Goals and CMS Choice   CMS Medicare.gov Compare Post Acute Care list provided to:: Patient Choice offered to / list presented to : Patient      Expected Discharge Plan and Services   Discharge Planning Services: CM Consult Post Acute Care Choice: IP Rehab Living arrangements for the past 2 months: Single Family Home                                      Prior Living Arrangements/Services Living arrangements for the past 2 months: Single Family Home Lives with:: Self Patient language and need for interpreter reviewed:: Yes Do you feel safe going back to the place where you live?: Yes          Current home services: DME (walker) Criminal Activity/Legal Involvement Pertinent to Current Situation/Hospitalization: No - Comment as needed  Activities of Daily Living Home Assistive Devices/Equipment: Cane (specify quad or straight) ADL Screening (condition at time of admission) Patient's cognitive ability adequate to safely complete daily activities?: Yes Is the patient deaf or have difficulty hearing?: No Does the patient have difficulty seeing, even when wearing glasses/contacts?: No Does the patient have difficulty concentrating, remembering, or making decisions?: No Patient able  to express need for assistance with ADLs?: Yes Does the patient have difficulty dressing or bathing?: Yes Independently performs ADLs?: No Communication: Independent Dressing (OT): Needs assistance Is this a change from baseline?: Change from baseline, expected to last >3 days Grooming: Needs assistance Is this a change from baseline?: Change from baseline, expected to last >3 days Feeding: Independent Bathing: Needs assistance Is this a change from baseline?: Change from baseline, expected to last >3 days Toileting: Needs assistance Is this a change from baseline?: Change from baseline, expected to last >3days In/Out Bed: Needs assistance Is this a change from baseline?: Change from baseline, expected to last >3 days Walks in Home: Needs assistance Is this a change from baseline?: Change from baseline, expected to last >3 days Does the patient have difficulty walking or climbing stairs?: Yes Weakness of Legs: None Weakness of Arms/Hands: None  Permission Sought/Granted                  Emotional Assessment Appearance:: Appears stated age Attitude/Demeanor/Rapport: Engaged Affect (typically observed): Accepting Orientation: : Oriented to Self, Oriented to Place, Oriented to  Time, Oriented to Situation   Psych Involvement: No (comment)  Admission diagnosis:  Acute ischemic stroke [facility]) [I63.9] Acute CVA (cerebrovascular accident) [facility]) [I63.9] Patient Active Problem List   Diagnosis Date Noted   Mixed hyperlipidemia 05/08/2023   Acute ischemic stroke (HCC) 05/07/2023   Psoriasis 05/07/2023   Edentulous  05/07/2023   Former smoker 05/07/2023   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 [place] [address] Phone: [phone] Fax: [phone]     Social Determinants of Health (SDOH) Social History: SDOH Screenings   Food Insecurity: No Food Insecurity (05/07/2023)  Housing: Low Risk  (05/07/2023)  Transportation Needs: No Transportation Needs (05/07/2023)  Utilities: Not At Risk (05/07/2023)  Tobacco Use: Medium Risk (05/07/2023)   SDOH Interventions:     Readmission Risk Interventions     No data to display

## 2023-05-09 NOTE — Progress Notes (Signed)
ABI study completed.   Please see CV Procedures for preliminary results.  Alvy Alsop, RVT  2:23 PM 05/09/23

## 2023-05-09 NOTE — Progress Notes (Signed)
Inpatient Rehabilitation Admissions Coordinator   I met with patient at bedside for rehab assessment. He lives alone. Noted plans for OR pending. We will follow up postoperative to assist with planning rehab venue options depending on his progress and caregiver supports needed.  Ottie Glazier, RN, MSN Rehab Admissions Coordinator [phone] 05/09/2023 12:24 PM

## 2023-05-09 NOTE — Consult Note (Signed)
WOC Nurse Consult Note: Reason for Consult: consult requested for right foot.  Performed remotely after review of progress notes and photos in the EMR. Wound type: Right foot with chronic full thickness wound; 90% red, 10% yellow, mod amt tan drainage.  Dressing procedure/placement/frequency: Topical treatment orders provided for bedside nurses to perform as follows to provide antimicrobial benefits and absorb drainage: Apply Aquacel [place] # [phone]) to right foot wound Q day, then cover with foam dressing.  (Change foam dressing Q 3 days or PRN soiling.) Please re-consult if further assistance is needed.  Thank-you,  Cammie Mcgee MSN, RN, CWOCN, [place], CNS [phone]

## 2023-05-09 NOTE — Progress Notes (Signed)
[facility] Progress Note Patient: Kenneth [name] QMV:[id number] DOB: Sep 11, 1946 DOA: 05/07/2023  DOS: the patient was seen and examined on 05/09/2023  Brief hospital course: 77 year old gentleman with past history of hypertension, psoriasis, former longtime smoker who quit several months ago, former heavy alcohol drinker, quit months ago, who lives alone and is followed by Dr. Felecia Shelling for primary care.  Patient was discovered by brother on the day of admission with difficulty walking and a facial droop and weakness on the left upper extremity.  He was sent to see his primary care physician and subsequently sent to the [facility] emergency department for further evaluation and concern for stroke.   Neurology was not consulted.  MRI shows evidence of left cerebellar stroke.  Which will explain his ataxia but would not explain his left upper extremity weakness.  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  Neurosurgery was consulted.  Per neurosurgery patient will require decompressive surgery sometime this week.  Assessment and Plan: Cervical myelopathy. Progressively worsening. Patient will require operative decompression. Dr. Franky Macho from neurosurgery following. Recommending surgery on Friday or Saturday. Appreciate assistance. Patient did receive 1 dose of Plavix on 6/26 in the morning. Currently on Decadron.  Acute left cerebellar stroke. Discussed with neurology on 6/27. Seen by neurology on 6/26. Etiology most likely small vessel disease.  Recommend aspirin and Plavix for 3 weeks once cleared by neurosurgery followed by aspirin alone. Cleared by speech for swallowing. PT OT recommending CIR.  Right leg ulcer. Prior history of left leg ulcer. Patient has history of ulcer on his left dorsum foot. Now present to the hospital with complaints of right dorsal foot ulceration. Does not appear to be acutely infected. Will check  x-ray and ABI. Appreciate wound care assistance.  Essential hypertension. Currently holding home medication to allow permissive hypertension.  HLD. LDL less than 70. On Lipitor 10 mg continue.  History of psoriasis. On Skyrizi. For now we will monitor. Currently on Decadron.  COPD. No evidence of exacerbation. Currently on Brovana Pulmicort and DuoNebs. Will monitor.  Subjective: No acute complaint.  No nausea no vomiting.  Reports ulceration on the right leg.  No chest pain.  Physical Exam: General: in Mild distress, No Rash, right foot ulcer without any erythema. Cardiovascular: S1 and S2 Present, No Murmur Respiratory: Good respiratory effort, Bilateral Air entry present. No Crackles, No wheezes Abdomen: Bowel Sound present, No tenderness Extremities: No edema Neuro: Alert and oriented x3, no new focal deficit, left-sided weakness noted.  Data Reviewed: I have Reviewed nursing notes, Vitals, and Lab results. Since last encounter, pertinent lab results CBC and BMP   . I have ordered test including CBC and BMP  . I have discussed pt's care plan and test results with neurosurgery and neurology  .  Ordered ABI.  Disposition: Status is: Inpatient Remains inpatient appropriate because: Need surgical operation sometime this week.  enoxaparin (LOVENOX) injection 40 mg Start: 05/07/23 1500   Family Communication: No one at bedside Level of care: Telemetry Medical   Vitals:   05/09/23 0851 05/09/23 0852 05/09/23 0853 05/09/23 1124  BP:    (!) 146/79  Pulse:    66  Resp:    16  Temp:    98.6 F (37 C)  TempSrc:    Oral  SpO2: 99% 99% 99% 99%  Weight:      Height:         Author: Lynden Oxford, MD 05/09/2023 4:18  PM  Please look on [email or web address] to find out who is on call.

## 2023-05-10 ENCOUNTER — Other Ambulatory Visit: Payer: Self-pay | Admitting: Neurosurgery

## 2023-05-10 ENCOUNTER — Encounter (HOSPITAL_COMMUNITY): Payer: Self-pay | Admitting: Internal Medicine

## 2023-05-10 ENCOUNTER — Encounter (HOSPITAL_COMMUNITY)
Admission: EM | Disposition: A | Discharge status: Skilled Nursing Facility | Payer: Self-pay | Source: Ambulatory Visit | Attending: Internal Medicine

## 2023-05-10 ENCOUNTER — Inpatient Hospital Stay (HOSPITAL_COMMUNITY): Payer: 59

## 2023-05-10 ENCOUNTER — Inpatient Hospital Stay (HOSPITAL_COMMUNITY): Payer: 59 | Admitting: Anesthesiology

## 2023-05-10 DIAGNOSIS — E782 Mixed hyperlipidemia: Secondary | ICD-10-CM

## 2023-05-10 DIAGNOSIS — I639 Cerebral infarction, unspecified: Secondary | ICD-10-CM | POA: Diagnosis not present

## 2023-05-10 DIAGNOSIS — Z87891 Personal history of nicotine dependence: Secondary | ICD-10-CM

## 2023-05-10 DIAGNOSIS — M4802 Spinal stenosis, cervical region: Secondary | ICD-10-CM

## 2023-05-10 HISTORY — PX: ANTERIOR CERVICAL DECOMPRESSION/DISCECTOMY FUSION 4 LEVELS: SHX5556

## 2023-05-10 LAB — TYPE AND SCREEN
ABO/RH(D): B POS
Antibody Screen: NEGATIVE

## 2023-05-10 LAB — SURGICAL PCR SCREEN
MRSA, PCR: POSITIVE — AB
Staphylococcus aureus: POSITIVE — AB

## 2023-05-10 LAB — ABO/RH: ABO/RH(D): B POS

## 2023-05-10 SURGERY — ANTERIOR CERVICAL DECOMPRESSION/DISCECTOMY FUSION 4 LEVELS
Anesthesia: General | Site: Spine Cervical

## 2023-05-10 MED ORDER — ALBUMIN HUMAN 5 % IV SOLN
INTRAVENOUS | Status: AC
  Filled 2023-05-10: qty 250

## 2023-05-10 MED ORDER — PROPOFOL 10 MG/ML IV BOLUS
INTRAVENOUS | Status: DC | PRN
  Administered 2023-05-10: 120 mg via INTRAVENOUS

## 2023-05-10 MED ORDER — LACTATED RINGERS IV SOLN: INTRAVENOUS | Status: DC | PRN

## 2023-05-10 MED ORDER — THROMBIN 20000 UNITS EX SOLR
CUTANEOUS | Status: AC
  Filled 2023-05-10: qty 20000

## 2023-05-10 MED ORDER — FENTANYL CITRATE (PF) 250 MCG/5ML IJ SOLN
INTRAMUSCULAR | Status: AC
  Filled 2023-05-10: qty 5

## 2023-05-10 MED ORDER — LIDOCAINE-EPINEPHRINE 0.5 %-1:200000 IJ SOLN
INTRAMUSCULAR | Status: DC | PRN
  Administered 2023-05-10: 4 mL

## 2023-05-10 MED ORDER — PROPOFOL 10 MG/ML IV BOLUS
INTRAVENOUS | Status: AC
  Filled 2023-05-10: qty 20

## 2023-05-10 MED ORDER — 0.9 % SODIUM CHLORIDE (POUR BTL) OPTIME
TOPICAL | Status: DC | PRN
  Administered 2023-05-10: 1000 mL

## 2023-05-10 MED ORDER — LIDOCAINE 2% (20 MG/ML) 5 ML SYRINGE
INTRAMUSCULAR | Status: DC | PRN
  Administered 2023-05-10: 60 mg via INTRAVENOUS

## 2023-05-10 MED ORDER — THROMBIN 20000 UNITS EX SOLR
CUTANEOUS | Status: DC | PRN
  Administered 2023-05-10: 20 mL via TOPICAL

## 2023-05-10 MED ORDER — PHENYLEPHRINE 80 MCG/ML (10ML) SYRINGE FOR IV PUSH (FOR BLOOD PRESSURE SUPPORT)
PREFILLED_SYRINGE | INTRAVENOUS | Status: DC | PRN
  Administered 2023-05-10: 80 ug via INTRAVENOUS

## 2023-05-10 MED ORDER — THROMBIN 5000 UNITS EX SOLR
OROMUCOSAL | Status: DC | PRN
  Administered 2023-05-10: 5 mL via TOPICAL

## 2023-05-10 MED ORDER — ONDANSETRON HCL 4 MG/2ML IJ SOLN
INTRAMUSCULAR | Status: AC
  Filled 2023-05-10: qty 2

## 2023-05-10 MED ORDER — DEXAMETHASONE SODIUM PHOSPHATE 10 MG/ML IJ SOLN
INTRAMUSCULAR | Status: DC | PRN
  Administered 2023-05-10 (×2): 5 mg via INTRAVENOUS

## 2023-05-10 MED ORDER — FENTANYL CITRATE (PF) 100 MCG/2ML IJ SOLN: 25.0000 ug | INTRAMUSCULAR | Status: DC | PRN

## 2023-05-10 MED ORDER — CHLORHEXIDINE GLUCONATE 0.12 % MT SOLN
OROMUCOSAL | Status: AC
  Administered 2023-05-10: 15 mL
  Filled 2023-05-10: qty 15

## 2023-05-10 MED ORDER — ACETAMINOPHEN 10 MG/ML IV SOLN
INTRAVENOUS | Status: AC
  Filled 2023-05-10: qty 100

## 2023-05-10 MED ORDER — ROCURONIUM BROMIDE 10 MG/ML (PF) SYRINGE
PREFILLED_SYRINGE | INTRAVENOUS | Status: AC
  Filled 2023-05-10: qty 10

## 2023-05-10 MED ORDER — ACETAMINOPHEN 10 MG/ML IV SOLN
INTRAVENOUS | Status: DC | PRN
  Administered 2023-05-10: 1000 mg via INTRAVENOUS

## 2023-05-10 MED ORDER — FENTANYL CITRATE (PF) 250 MCG/5ML IJ SOLN
INTRAMUSCULAR | Status: DC | PRN
  Administered 2023-05-10: 100 ug via INTRAVENOUS
  Administered 2023-05-10: 50 ug via INTRAVENOUS
  Administered 2023-05-10: 25 ug via INTRAVENOUS
  Administered 2023-05-10: 50 ug via INTRAVENOUS
  Administered 2023-05-10: 25 ug via INTRAVENOUS
  Administered 2023-05-10: 50 ug via INTRAVENOUS

## 2023-05-10 MED ORDER — ROCURONIUM BROMIDE 10 MG/ML (PF) SYRINGE
PREFILLED_SYRINGE | INTRAVENOUS | Status: DC | PRN
  Administered 2023-05-10: 10 mg via INTRAVENOUS
  Administered 2023-05-10: 50 mg via INTRAVENOUS
  Administered 2023-05-10 (×4): 20 mg via INTRAVENOUS

## 2023-05-10 MED ORDER — THROMBIN 5000 UNITS EX SOLR
CUTANEOUS | Status: AC
  Filled 2023-05-10: qty 5000

## 2023-05-10 MED ORDER — SUGAMMADEX SODIUM 200 MG/2ML IV SOLN
INTRAVENOUS | Status: DC | PRN
  Administered 2023-05-10: 200 mg via INTRAVENOUS

## 2023-05-10 MED ORDER — NAPHAZOLINE-GLYCERIN 0.012-0.25 % OP SOLN
1.0000 [drp] | Freq: Four times a day (QID) | OPHTHALMIC | Status: DC | PRN
  Administered 2023-05-11 – 2023-05-12 (×2): 2 [drp] via OPHTHALMIC
  Administered 2023-05-13: 1 [drp] via OPHTHALMIC
  Filled 2023-05-10 (×2): qty 15

## 2023-05-10 MED ORDER — CEFAZOLIN SODIUM-DEXTROSE 2-4 GM/100ML-% IV SOLN
2.0000 g | INTRAVENOUS | Status: AC
  Administered 2023-05-10: 2 g via INTRAVENOUS

## 2023-05-10 MED ORDER — CHLORHEXIDINE GLUCONATE CLOTH 2 % EX PADS: 6.0000 | MEDICATED_PAD | Freq: Once | CUTANEOUS | Status: DC

## 2023-05-10 MED ORDER — AMISULPRIDE (ANTIEMETIC) 5 MG/2ML IV SOLN: 10.0000 mg | Freq: Once | INTRAVENOUS | Status: DC | PRN

## 2023-05-10 MED ORDER — PHENYLEPHRINE HCL-NACL 20-0.9 MG/250ML-% IV SOLN
INTRAVENOUS | Status: DC | PRN
  Administered 2023-05-10: 40 ug/min via INTRAVENOUS

## 2023-05-10 MED ORDER — MUPIROCIN 2 % EX OINT
1.0000 | TOPICAL_OINTMENT | Freq: Two times a day (BID) | CUTANEOUS | Status: AC
  Administered 2023-05-11 – 2023-05-15 (×10): 1 via NASAL
  Filled 2023-05-10: qty 22

## 2023-05-10 MED ORDER — ONDANSETRON HCL 4 MG/2ML IJ SOLN
INTRAMUSCULAR | Status: DC | PRN
  Administered 2023-05-10: 4 mg via INTRAVENOUS

## 2023-05-10 MED ORDER — CEFAZOLIN SODIUM-DEXTROSE 2-4 GM/100ML-% IV SOLN
INTRAVENOUS | Status: AC
  Filled 2023-05-10: qty 100

## 2023-05-10 MED ORDER — CHLORHEXIDINE GLUCONATE CLOTH 2 % EX PADS
6.0000 | MEDICATED_PAD | Freq: Every day | CUTANEOUS | Status: DC
  Administered 2023-05-11 – 2023-05-15 (×4): 6 via TOPICAL

## 2023-05-10 MED ORDER — DEXAMETHASONE SODIUM PHOSPHATE 10 MG/ML IJ SOLN
INTRAMUSCULAR | Status: AC
  Filled 2023-05-10: qty 1

## 2023-05-10 MED ORDER — LIDOCAINE 2% (20 MG/ML) 5 ML SYRINGE
INTRAMUSCULAR | Status: AC
  Filled 2023-05-10: qty 5

## 2023-05-10 MED ORDER — LIDOCAINE-EPINEPHRINE 0.5 %-1:200000 IJ SOLN
INTRAMUSCULAR | Status: AC
  Filled 2023-05-10: qty 50

## 2023-05-10 SURGICAL SUPPLY — 62 items
ADH SKN CLS APL DERMABOND .7 (GAUZE/BANDAGES/DRESSINGS) ×1
ADH SKN CLS LQ APL DERMABOND (GAUZE/BANDAGES/DRESSINGS) ×1
BAG COUNTER SPONGE SURGICOUNT (BAG) ×1 IMPLANT
BAG SPNG CNTER NS LX DISP (BAG) ×1
BLADE CLIPPER SURG (BLADE) IMPLANT
BONE SPACER C-PLUG 14X6X12 (Bone Implant) IMPLANT
BONE SPACER C-PLUG 14X7X12 (Bone Implant) IMPLANT
BONE SPACER C-PLUG 14X8X12 (Bone Implant) IMPLANT
BUR DRUM 4.0 (BURR) ×1 IMPLANT
BUR MATCHSTICK NEURO 3.0 LAGG (BURR) ×1 IMPLANT
CANISTER SUCT 3000ML PPV (MISCELLANEOUS) ×1 IMPLANT
DERMABOND ADVANCED .7 DNX12 (GAUZE/BANDAGES/DRESSINGS) ×1 IMPLANT
DERMABOND ADVANCED .7 DNX6 (GAUZE/BANDAGES/DRESSINGS) IMPLANT
DRAPE HALF SHEET 40X57 (DRAPES) IMPLANT
DRAPE LAPAROTOMY 100X72 PEDS (DRAPES) ×1 IMPLANT
DRAPE MICROSCOPE SLANT 54X150 (MISCELLANEOUS) ×1 IMPLANT
DURAPREP 6ML APPLICATOR 50/CS (WOUND CARE) ×1 IMPLANT
ELECT COATED BLADE 2.86 ST (ELECTRODE) ×1 IMPLANT
ELECT REM PT RETURN 9FT ADLT (ELECTROSURGICAL) ×1
ELECTRODE REM PT RTRN 9FT ADLT (ELECTROSURGICAL) ×1 IMPLANT
GAUZE 4X4 16PLY ~~LOC~~+RFID DBL (SPONGE) IMPLANT
GLOVE BIO SURGEON STRL SZ7 (GLOVE) IMPLANT
GLOVE BIOGEL PI IND STRL 7.5 (GLOVE) IMPLANT
GLOVE ECLIPSE 6.5 STRL STRAW (GLOVE) ×1 IMPLANT
GLOVE EXAM NITRILE XL STR (GLOVE) IMPLANT
GOWN STRL REUS W/ TWL LRG LVL3 (GOWN DISPOSABLE) ×2 IMPLANT
GOWN STRL REUS W/ TWL XL LVL3 (GOWN DISPOSABLE) IMPLANT
GOWN STRL REUS W/TWL 2XL LVL3 (GOWN DISPOSABLE) IMPLANT
GOWN STRL REUS W/TWL LRG LVL3 (GOWN DISPOSABLE) ×4
GOWN STRL REUS W/TWL XL LVL3 (GOWN DISPOSABLE)
HALTER HD/CHIN CERV TRACTION D (MISCELLANEOUS) IMPLANT
HEMOSTAT SURGICEL 2X14 (HEMOSTASIS) IMPLANT
KIT BASIN OR (CUSTOM PROCEDURE TRAY) ×1 IMPLANT
KIT TURNOVER KIT B (KITS) ×1 IMPLANT
NDL HYPO 25X1 1.5 SAFETY (NEEDLE) ×1 IMPLANT
NDL SPNL 22GX3.5 QUINCKE BK (NEEDLE) ×2 IMPLANT
NEEDLE HYPO 25X1 1.5 SAFETY (NEEDLE) ×1 IMPLANT
NEEDLE SPNL 22GX3.5 QUINCKE BK (NEEDLE) ×2 IMPLANT
NS IRRIG 1000ML POUR BTL (IV SOLUTION) ×1 IMPLANT
PACK LAMINECTOMY NEURO (CUSTOM PROCEDURE TRAY) ×1 IMPLANT
PAD ARMBOARD 7.5X6 YLW CONV (MISCELLANEOUS) ×1 IMPLANT
PLATE ACP 1-LEVEL 1.6V20 (Plate) IMPLANT
PLATE ACP 1.6 18 (Plate) IMPLANT
PLATE ACP 1.9H 18 1-LEVEL (Plate) IMPLANT
PLATE ACP 1.9H 20 1-LEVEL (Plate) IMPLANT
SCREW ACP 3.5 X 13 S/D VARIA (Screw) ×6 IMPLANT
SCREW ACP 3.5X13 S/D VAR ANGLE (Screw) IMPLANT
SCREW ACP ST VA 3.5X11 (Screw) IMPLANT
SCREW ACP ST VARI 3.5X13 (Screw) IMPLANT
SCREW ACP VA SD 3.5X15 (Screw) IMPLANT
SCREW ACP VA ST 4X13 (Screw) IMPLANT
SOL ELECTROSURG ANTI STICK (MISCELLANEOUS) ×1
SOLUTION ELECTROSURG ANTI STCK (MISCELLANEOUS) ×1 IMPLANT
SPIKE FLUID TRANSFER (MISCELLANEOUS) ×1 IMPLANT
SPONGE INTESTINAL PEANUT (DISPOSABLE) ×1 IMPLANT
SPONGE SURGIFOAM ABS GEL 100 (HEMOSTASIS) IMPLANT
SUT VIC AB 0 CT1 27 (SUTURE) ×1
SUT VIC AB 0 CT1 27XBRD ANTBC (SUTURE) IMPLANT
SUT VIC AB 3-0 SH 8-18 (SUTURE) ×1 IMPLANT
TOWEL GREEN STERILE (TOWEL DISPOSABLE) ×1 IMPLANT
TOWEL GREEN STERILE FF (TOWEL DISPOSABLE) ×1 IMPLANT
WATER STERILE IRR 1000ML POUR (IV SOLUTION) ×1 IMPLANT

## 2023-05-10 NOTE — Progress Notes (Signed)
Occupational Therapy Treatment Patient Details Name: Kenneth [name] MRN: [id number] DOB: 1946/07/01 Today's Date: 05/10/2023   History of present illness Pt is a 77 y.o. male who presented to [facility] 05/07/23 with L-sided weakness and facial droop. 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 Pt transferred to [facility] 6/26. PMH: HTN, psoriasis   OT comments  Patient received in supine and agreeable to OT session. Patient continues to be mod assists for bed mobility and transfer to recliner. Patient demonstrated gain with grooming with min assist due to assistance needed with toothpaste and was setup for self feeding with built up spoon. Patient will benefit from intensive inpatient follow up therapy, >3 hours/day to address grooming, self feeding, and self care. Acute OT to continue to follow.    Recommendations for follow up therapy are one component of a multi-disciplinary discharge planning process, led by the attending physician.  Recommendations may be updated based on patient status, additional functional criteria and insurance authorization.    Assistance Recommended at Discharge Intermittent Supervision/Assistance  Patient can return home with the following  A lot of help with walking and/or transfers;A lot of help with bathing/dressing/bathroom;Assistance with cooking/housework;Assistance with feeding;Assist for transportation;Help with stairs or ramp for entrance   Equipment Recommendations  None recommended by OT     Recommendations for Other Services      Precautions / Restrictions Precautions Precautions: Fall Restrictions Weight Bearing Restrictions: No       Mobility Bed Mobility Overal bed mobility: Needs Assistance Bed Mobility: Supine to Sit, Rolling Rolling: Min assist   Supine to sit: Mod assist, HOB elevated     General bed mobility comments: patient required assistance with BLEs and instructions on bed rail use to assist    Transfers Overall transfer level: Needs assistance Equipment used: Rolling walker (2 wheels) Transfers: Sit to/from Stand, Bed to chair/wheelchair/BSC Sit to Stand: Mod assist     Step pivot transfers: Mod assist     General transfer comment: cues for hand placement and feet blocked due to feet sliding     Balance Overall balance assessment: Needs assistance Sitting-balance support: Feet supported, No upper extremity supported Sitting balance-Leahy Scale: Fair Sitting balance - Comments: seated at EOB   Standing balance support: Bilateral upper extremity supported, During functional activity, Reliant on assistive device for balance Standing balance-Leahy Scale: Poor Standing balance comment: reliant on RW for support                           ADL either performed or assessed with clinical judgement   ADL Overall ADL's : Needs assistance/impaired Eating/Feeding: Set up;Sitting;With adaptive utensils Eating/Feeding Details (indicate cue type and reason): Patient required assistance with setup and used built up spoon for self feeding Grooming: Wash/dry hands;Wash/dry face;Oral care;Minimal assistance;Sitting Grooming Details (indicate cue type and reason): required assistance with toothpaste         Upper Body Dressing : Moderate assistance;Sitting Upper Body Dressing Details (indicate cue type and reason): to donn new Armed forces training and education officer Transfer:  Moderate assistance;Rolling walker (2 wheels) Toilet Transfer Details (indicate cue  type and reason): simulated to recliner           General ADL Comments: self care tasks performed seated due to unsteady standing balance    Extremity/Trunk Assessment              Vision       Perception     Praxis      Cognition Arousal/Alertness: Awake/alert Behavior During Therapy: WFL for tasks assessed/performed Overall Cognitive Status: Within Functional Limits for tasks assessed                                 General Comments: alert and oriented x3.        Exercises      Shoulder Instructions       General Comments      Pertinent Vitals/ Pain       Pain Assessment Pain Assessment: Faces Faces Pain Scale: Hurts a little bit Pain Location: generalized with mobility Pain Descriptors / Indicators: Discomfort, Grimacing Pain Intervention(s): Monitored during session, Repositioned  Home Living                                          Prior Functioning/Environment              Frequency  Min 2X/week        Progress Toward Goals  OT Goals(current goals can now be found in the care plan section)  Progress towards OT goals: Progressing toward goals  Acute Rehab OT Goals Patient Stated Goal: go home OT Goal Formulation: With patient Time For Goal Achievement: 05/22/23 Potential to Achieve Goals: Good ADL Goals Pt Will Perform Eating: with set-up;sitting Pt Will Perform Grooming: with set-up;sitting Pt Will Perform Upper Body Bathing: with set-up;sitting Pt Will Perform Lower Body Bathing: with min assist;sitting/lateral leans Pt Will Perform Upper Body Dressing: with set-up;sitting Pt Will Perform Lower Body Dressing: with min assist;sitting/lateral leans Pt Will Transfer to Toilet: with min guard assist;ambulating Pt Will Perform Toileting - Clothing Manipulation and hygiene: sitting/lateral leans;with min guard assist Pt/caregiver will Perform Home Exercise Program: Increased ROM;Increased  strength;Left upper extremity;With minimal assist  Plan Discharge plan remains appropriate    Co-evaluation                 AM-PAC OT "6 Clicks" Daily Activity     Outcome Measure   Help from another person eating meals?: A Little Help from another person taking care of personal grooming?: A Little Help from another person toileting, which includes using toliet, bedpan, or urinal?: A Lot Help from another person bathing (including washing, rinsing, drying)?: A Lot Help from another person to put on and taking off regular upper body clothing?: A Lot Help from another person to put on and taking off regular lower body clothing?: A Lot 6 Click Score: 14    End of Session Equipment Utilized During Treatment: Rolling walker (2 wheels)  OT Visit Diagnosis: Unsteadiness on feet (R26.81);Other abnormalities of gait and mobility (R26.89);History of falling (Z91.81);Ataxia, unspecified (R27.0);Other symptoms and signs involving the nervous system (R29.898);Hemiplegia and hemiparesis Hemiplegia - Right/Left: Left Hemiplegia - dominant/non-dominant: Non-Dominant Hemiplegia - caused by: Cerebral infarction   Activity Tolerance Patient tolerated treatment well   Patient Left in chair;with call bell/phone within reach;with  chair alarm set   Nurse Communication Mobility status        Time: [phone] OT Time Calculation (min): 28 min  Charges: OT General Charges $OT Visit: 1 Visit OT Treatments $Self Care/Home Management : 23-37 mins  Alfonse Flavors, OTA Acute Rehabilitation Services  Office [phone]   Dewain Penning 05/10/2023, 8:08 AM

## 2023-05-10 NOTE — Significant Event (Signed)
Patient taken to peri op for surgery; taken via bed by transport. Patient has been bathed with CHG; oral care done. No personal effects on patient. RN called family to let them know. Report given to receiving staff in peri op.

## 2023-05-10 NOTE — Transfer of Care (Addendum)
Immediate Anesthesia Transfer of Care Note  Patient: Kenneth [name]  Procedure(s) Performed: Cervical three to Cervical seven Anterior Cervical Decompression Fusion (Spine Cervical)  Patient Location: PACU  Anesthesia Type:General  Level of Consciousness: awake, alert , and oriented  Airway & Oxygen Therapy: Patient Spontanous Breathing and Patient connected to nasal cannula oxygen  Post-op Assessment: Report given to RN and Post -op Vital signs reviewed and stable  Post vital signs: Reviewed and stable  Last Vitals:  Vitals Value Taken Time  BP 106/60 05/10/23 2332  Temp    Pulse 83 05/10/23 2333  Resp 14 05/10/23 2333  SpO2 99 % 05/10/23 2333  Vitals shown include unvalidated device data.  Last Pain:  Vitals:   05/10/23 1550  TempSrc: Oral  PainSc: 0-No pain      Patients Stated Pain Goal: 0 (05/10/23 1550)  Complications: No notable events documented.  Patient's left eye reddened. Denies pain.

## 2023-05-10 NOTE — Progress Notes (Addendum)
Kenneth [name], Kenneth [name]  called reporting his surgical pcr was MRSA positive and Dr Franky Macho has been aware of this [name].

## 2023-05-10 NOTE — Anesthesia Procedure Notes (Addendum)
Procedure Name: Intubation Date/Time: 05/10/2023 6:14 PM  Performed by: Gwenyth Allegra, CRNAPre-anesthesia Checklist: Patient identified, Emergency Drugs available, Suction available and Patient being monitored Patient Re-evaluated:Patient Re-evaluated prior to induction Oxygen Delivery Method: Circle System Utilized Preoxygenation: Pre-oxygenation with 100% oxygen Induction Type: IV induction Ventilation: Mask ventilation without difficulty and Oral airway inserted - appropriate to patient size Laryngoscope Size: Glidescope and 3 Grade View: Grade I Tube type: Oral Number of attempts: 1 Airway Equipment and Method: Oral airway, Rigid stylet and Video-laryngoscopy Placement Confirmation: ETT inserted through vocal cords under direct vision, positive ETCO2 and breath sounds checked- equal and bilateral Secured at: 22 cm Tube secured with: Tape Dental Injury: Teeth and Oropharynx as per pre-operative assessment  Comments: Performed by Justice Britain

## 2023-05-10 NOTE — Anesthesia Preprocedure Evaluation (Signed)
Anesthesia Evaluation    Reviewed: Allergy & Precautions, Patient's Chart, lab work & pertinent test results, Unable to perform ROS - Chart review only  Airway Mallampati: II  TM Distance: >3 FB Neck ROM: Limited    Dental  (+) Edentulous Upper, Edentulous Lower   Pulmonary neg pulmonary ROS, former smoker   Pulmonary exam normal breath sounds clear to auscultation       Cardiovascular hypertension, Pt. on medications Normal cardiovascular exam Rhythm:Regular Rate:Normal  Echo 05/07/2023  1. Left ventricular ejection fraction, by estimation, is 55 to 60%. The left ventricle has normal function. The left ventricle has no regional wall motion abnormalities. There is mild left ventricular hypertrophy. Left ventricular diastolic parameters are consistent with Grade I diastolic dysfunction (impaired relaxation).   2. Right ventricular systolic function is normal. The right ventricular size is normal. Tricuspid regurgitation signal is inadequate for assessing PA pressure.   3. The mitral valve is normal in structure. Trivial mitral valve regurgitation. No evidence of mitral stenosis.   4. The aortic valve is tricuspid. Aortic valve regurgitation is not visualized. No aortic stenosis is present.   5. Aortic dilatation noted. There is borderline dilatation of the aortic root, measuring 36 mm.   6. The inferior vena cava is normal in size with greater than 50% respiratory variability, suggesting right atrial pressure of 3 mmHg.   Comparison(s): No prior Echocardiogram.     Neuro/Psych CVA    GI/Hepatic negative GI ROS, Neg liver ROS,,,  Endo/Other  negative endocrine ROS    Renal/GU negative Renal ROS     Musculoskeletal negative musculoskeletal ROS (+)    Abdominal   Peds  Hematology negative hematology ROS (+)   Anesthesia Other Findings   Reproductive/Obstetrics                             Anesthesia  Physical Anesthesia Plan  ASA: 3  Anesthesia Plan: General   Post-op Pain Management: Ofirmev IV (intra-op)*   Induction: Intravenous  PONV Risk Score and Plan: 3 and Ondansetron, Dexamethasone and Treatment may vary due to age or medical condition  Airway Management Planned: Oral ETT and Video Laryngoscope Planned  Additional Equipment:   Intra-op Plan:   Post-operative Plan: Extubation in OR  Informed Consent: I have reviewed the patients History and Physical, chart, labs and discussed the procedure including the risks, benefits and alternatives for the proposed anesthesia with the patient or authorized representative who has indicated his/her understanding and acceptance.     Dental advisory given  Plan Discussed with: CRNA  Anesthesia Plan Comments:         Anesthesia Quick Evaluation

## 2023-05-10 NOTE — Op Note (Signed)
05/10/2023  12:06 AM  PATIENT:  Kenneth [name]  77 y.o. male With progressive weakness, severe cervical stenosis, and myelopathy on exam. 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 Cerbone was taken to the operating room, intubated, and placed under general anesthesia without difficulty. He was positioned supine with his head in slight extension on a horseshoe headrest. The neck was prepped and draped in a sterile manner. I infiltrated 4 cc's 1/2%lidocaine/1:200,000 strength epinephrine into the planned incision starting from the midline to the medial border of the left sternocleidomastoid muscle. I opened the incision with a 10 blade and dissected sharply through soft tissue to the platysma. I dissected in the plane superior to the platysma both rostrally and caudally. I then opened the platysma in a horizontal fashion with Metzenbaum scissors, and dissected in the inferior plane rostrally and caudally. With both blunt and sharp technique I created an avascular corridor to the cervical spine. I placed a spinal needle(s) in the disc space at 3/4 . I then reflected the longus colli from C3 to C7 and placed self retaining retractors. I opened the disc space(s) at 3/4,4/5,5/6, and 6/7 with a 15 blade. I removed  disc with curettes, Kerrison punches, and the drill. Using the drill I removed osteophytes and prepared for the decompression.  I decompressed the spinal canal and the C4,5,6,and 7 root(s) with the drill, Kerrison punches, and the curettes. I used the microscope to aid in microdissection. I removed the posterior longitudinal ligament to fully expose and decompress the thecal sac. I exposed the roots laterally taking down the 3/4,4/5,5/6, and 6/7 uncovertebral joints. With the decompression complete I moved on to the arthrodesis. I used the drill to level the surfaces of C3,4,5,6,and 7. I removed soft tissue to prepare the disc space and the bony surfaces. I measured the spaces and placed appropriate sized  structural allografts into the disc spaces.  I then placed the anterior instrumentation. I placed 4 separate plates accounting for the 4 level operation. Three screws at C3/4, three screws at 4/5, three at 5/6, and 2 at 6/7.  I locked the screws into place. Intraoperative xray showed the graft, plate, and screws to be in good position. I irrigated the wound, achieved hemostasis, and closed the wound in layers. I approximated the platysma, and the subcuticular plane with vicryl sutures. I used Dermabond for a sterile dressing.   PLAN OF CARE: Admit to inpatient   PATIENT DISPOSITION:  PACU - hemodynamically stable.   Delay start of Pharmacological VTE agent (>24hrs) due to surgical blood loss or risk of bleeding:  yes

## 2023-05-10 NOTE — Significant Event (Addendum)
Patient is requesting that his brother sign the consents for surgery and blood on his behalf due to weakness on his extremities and fingers. Other family members at bedside. Consents placed in chart.

## 2023-05-10 NOTE — Progress Notes (Signed)
[facility] Progress Note Patient: Kenneth [name] YNW:[id number] DOB: [birth year] DOA: 05/07/2023  DOS: the patient was seen and examined on 05/10/2023  Brief hospital course: 77 year old gentleman with past history of hypertension, psoriasis, former longtime smoker who quit several months ago, former heavy alcohol drinker, quit months ago, who lives alone and is followed by Dr. Felecia Shelling for primary care.  Patient was discovered by brother on the day of admission with difficulty walking and a facial droop and weakness on the left upper extremity.  He was sent to see his primary care physician and subsequently sent to the [facility] emergency department for further evaluation and concern for stroke.   Neurology was not consulted.  MRI shows evidence of left cerebellar stroke.  Which will explain his ataxia but would not explain his left upper extremity weakness.  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  Neurosurgery was consulted.  Per neurosurgery patient will require decompressive surgery sometime this week.  Assessment and Plan: Cervical myelopathy. Progressively worsening. Patient will require operative decompression. Dr. Franky Macho from neurosurgery following.  Surgery scheduled on 6/28. Appreciate assistance. Patient did receive 1 dose of Plavix on 6/26 in the morning. Currently on Decadron. Monitor postop course.  Acute left cerebellar stroke. Discussed with neurology on 6/27. Seen by neurology on 6/26. Etiology most likely small vessel disease.  Recommend aspirin and Plavix for 3 weeks once cleared by neurosurgery followed by aspirin alone. Cleared by speech for swallowing. PT OT recommending CIR.  Right leg ulcer. Prior history of left leg ulcer. Patient has history of ulcer on his left dorsum foot. Now present to the hospital with complaints of right dorsal foot ulceration. Does not appear to be acutely infected. ABI  negative.  X-ray foot in the past unremarkable as well. Appreciate wound care assistance.  Essential hypertension. Currently holding home medication to allow permissive hypertension.  HLD. LDL less than 70. On Lipitor 10 mg continue.  History of psoriasis. On Skyrizi. For now we will monitor. Currently on Decadron.  COPD. No evidence of exacerbation. Currently on Brovana Pulmicort and DuoNebs. Will monitor.  Subjective: No acute complaint.  No nausea no vomiting no fever no chills.  Tells me that he used to drink alcohol until he gets drunk and quit drinking 2 months ago.  Physical Exam: General: in Mild distress, No Rash, right foot ulcer without any erythema. Cardiovascular: S1 and S2 Present, No Murmur Respiratory: Good respiratory effort, Bilateral Air entry present. No Crackles, No wheezes Abdomen: Bowel Sound present, No tenderness Extremities: No edema Neuro: Alert and oriented x3, no new focal deficit, left-sided weakness noted.  Data Reviewed: I have Reviewed nursing notes, Vitals, and Lab results. Reviewed CBC and BMP.  Reviewed her CBC and BMP.  Disposition: Status is: Inpatient Remains inpatient appropriate because: Need surgical operation sometime this week.  SCD's Start: 05/10/23 1312 enoxaparin (LOVENOX) injection 40 mg Start: 05/07/23 1500   Family Communication: No one at bedside Level of care: Telemetry Medical   Vitals:   05/10/23 0826 05/10/23 1106 05/10/23 1505 05/10/23 1550  BP:  (!) 146/72 (!) 153/78 (!) 159/75  Pulse:  62 61 (!) 59  Resp:  17  18  Temp:  97.8 F (36.6 C) 97.8 F (36.6 C) 98 F (36.7 C)  TempSrc:  Oral Oral Oral  SpO2: 99% 100% 100% 97%  Weight:      Height:         Author: Lynden Oxford,  MD 05/10/2023 6:12 PM  Please look on [email or web address] to find out who is on call.

## 2023-05-10 NOTE — Progress Notes (Signed)
Patient ID: Kenneth [name], male   DOB: 28-Sep-1946, 77 y.o.   MRN: [id number] BP (!) 159/75   Pulse (!) 59   Temp 98 F (36.7 C) (Oral)   Resp 18   Ht 5\' 4"  (1.626 m)   Wt 63.5 kg   SpO2 97%   BMI 24.03 kg/m  Alert, oriented, confused, following all commands Moving all extremities Left upper extremity 4-4-/5 Normal strength right upper extremity Hyper reflexic, poor fine motor coordination Gait not assessed OR for cervical decompression and arthrodesis

## 2023-05-11 ENCOUNTER — Other Ambulatory Visit: Payer: Self-pay

## 2023-05-11 DIAGNOSIS — I639 Cerebral infarction, unspecified: Secondary | ICD-10-CM | POA: Diagnosis not present

## 2023-05-11 MED ORDER — ONDANSETRON HCL 4 MG PO TABS: 4.0000 mg | ORAL_TABLET | Freq: Four times a day (QID) | ORAL | Status: DC | PRN

## 2023-05-11 MED ORDER — ONDANSETRON HCL 4 MG/2ML IJ SOLN: 4.0000 mg | Freq: Four times a day (QID) | INTRAMUSCULAR | Status: DC | PRN

## 2023-05-11 MED ORDER — DIAZEPAM 5 MG PO TABS: 5.0000 mg | ORAL_TABLET | Freq: Four times a day (QID) | ORAL | Status: DC | PRN

## 2023-05-11 MED ORDER — SODIUM CHLORIDE 0.9 % IV SOLN
250.0000 mL | INTRAVENOUS | Status: DC
  Administered 2023-05-11: 250 mL via INTRAVENOUS

## 2023-05-11 MED ORDER — ATORVASTATIN CALCIUM 10 MG PO TABS: 10.0000 mg | ORAL_TABLET | Freq: Every day | ORAL | Status: DC

## 2023-05-11 MED ORDER — MAGNESIUM CITRATE PO SOLN: 1.0000 | Freq: Once | ORAL | Status: DC | PRN

## 2023-05-11 MED ORDER — OXYCODONE HCL 5 MG PO TABS
5.0000 mg | ORAL_TABLET | ORAL | Status: DC | PRN
  Administered 2023-05-13 – 2023-05-14 (×3): 5 mg via ORAL
  Filled 2023-05-11 (×3): qty 1

## 2023-05-11 MED ORDER — DEXAMETHASONE SODIUM PHOSPHATE 4 MG/ML IJ SOLN
4.0000 mg | Freq: Three times a day (TID) | INTRAMUSCULAR | Status: DC
  Administered 2023-05-11 – 2023-05-13 (×8): 4 mg via INTRAVENOUS
  Filled 2023-05-11 (×8): qty 1

## 2023-05-11 MED ORDER — MENTHOL 3 MG MT LOZG: 1.0000 | LOZENGE | OROMUCOSAL | Status: DC | PRN

## 2023-05-11 MED ORDER — MORPHINE SULFATE (PF) 2 MG/ML IV SOLN
2.0000 mg | INTRAVENOUS | Status: DC | PRN
  Administered 2023-05-11: 2 mg via INTRAVENOUS
  Filled 2023-05-11: qty 1

## 2023-05-11 MED ORDER — SODIUM CHLORIDE 0.9% FLUSH: 3.0000 mL | INTRAVENOUS | Status: DC | PRN

## 2023-05-11 MED ORDER — OXYCODONE HCL 5 MG PO TABS: 10.0000 mg | ORAL_TABLET | ORAL | Status: DC | PRN

## 2023-05-11 MED ORDER — ORAL CARE MOUTH RINSE
15.0000 mL | OROMUCOSAL | Status: DC
  Administered 2023-05-11 – 2023-05-15 (×17): 15 mL via OROMUCOSAL

## 2023-05-11 MED ORDER — PHENOL 1.4 % MT LIQD: 1.0000 | OROMUCOSAL | Status: DC | PRN

## 2023-05-11 MED ORDER — BISACODYL 5 MG PO TBEC
5.0000 mg | DELAYED_RELEASE_TABLET | Freq: Every day | ORAL | Status: DC | PRN
  Administered 2023-05-14: 5 mg via ORAL
  Filled 2023-05-11: qty 1

## 2023-05-11 MED ORDER — POTASSIUM CHLORIDE IN NACL 20-0.9 MEQ/L-% IV SOLN
INTRAVENOUS | Status: DC
  Filled 2023-05-11 (×7): qty 1000

## 2023-05-11 MED ORDER — ALBUMIN HUMAN 5 % IV SOLN
12.5000 g | Freq: Once | INTRAVENOUS | Status: AC
  Administered 2023-05-11: 12.5 g via INTRAVENOUS

## 2023-05-11 MED ORDER — SENNA 8.6 MG PO TABS
1.0000 | ORAL_TABLET | Freq: Two times a day (BID) | ORAL | Status: DC
  Administered 2023-05-11 – 2023-05-15 (×7): 8.6 mg via ORAL
  Filled 2023-05-11 (×8): qty 1

## 2023-05-11 MED ORDER — OXYCODONE HCL ER 10 MG PO T12A
10.0000 mg | EXTENDED_RELEASE_TABLET | Freq: Two times a day (BID) | ORAL | Status: DC
  Administered 2023-05-11 – 2023-05-12 (×3): 10 mg via ORAL
  Filled 2023-05-11 (×3): qty 1

## 2023-05-11 MED ORDER — ORAL CARE MOUTH RINSE: 15.0000 mL | OROMUCOSAL | Status: DC | PRN

## 2023-05-11 MED ORDER — SODIUM CHLORIDE 0.9% FLUSH
3.0000 mL | Freq: Two times a day (BID) | INTRAVENOUS | Status: DC
  Administered 2023-05-11 – 2023-05-13 (×7): 3 mL via INTRAVENOUS

## 2023-05-11 NOTE — Evaluation (Signed)
Physical Therapy Evaluation Patient Details Name: Kenneth [name] MRN: [id number] DOB: 08-Jan-1946 Today's Date: 05/11/2023  History of Present Illness  Pt is a 77 y.o. male who presented to [facility] 05/07/23 with L-sided weakness and facial droop. 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 Pt transferred to [facility] 6/26. PMH: HTN, psoriasis. Pt is s/p ACDF C3-7 6/28.   Clinical Impression  Today's session focused on assessment of progressive functional mobility and new precaution adherence. Pt able to initiate log roll technique, with cueing required for hand placement and BLE navigation, HOB elevated. Pt unable to maintain grasp with LUE to R bed rail to assist in roll, requiring min therapist assist to complete to sidelying. Pt demos difficulty with RUE push to achieve upright seated, requiring therapist assist again. Mod A to achieve standing to RW with notable difficulties maintaining hand placement on the device. Demos 3-5 sidesteps for R lateral x2 person step pivot transfer to recliner, with poor eccentric control to lower. With pt deficits in generalized LE weakness, standing balance, and functional mobility, pt will continue to benefit from intensive inpatient follow up therapy, >3 hours/days to facilitate improvements in listed concerns.      Recommendations for follow up therapy are one component of a multi-disciplinary discharge planning process, led by the attending physician.  Recommendations may be updated based on  patient status, additional functional criteria and insurance authorization.  Follow Up Recommendations       Assistance Recommended at Discharge Intermittent Supervision/Assistance  Patient can return home with the following  A lot of help with walking and/or transfers;A lot of help with bathing/dressing/bathroom;Assistance with cooking/housework;Assist for transportation;Help with stairs or ramp for entrance    Equipment Recommendations Other (comment) (TBA)  Recommendations for Other Services       Functional Status Assessment Patient has had a recent decline in their functional status and demonstrates the ability to make significant improvements in function in a reasonable and predictable amount of time.     Precautions / Restrictions Precautions Precautions: Fall;Cervical;Other (comment) (hemovac) Precaution Booklet Issued: No Required Braces or Orthoses: Cervical Brace Cervical Brace: Soft collar;Other (comment) (apply while sitting) Restrictions Weight Bearing Restrictions: No      Mobility  Bed Mobility Overal bed mobility: Needs Assistance Bed Mobility: Supine to Sit, Rolling Rolling: Min assist   Supine to sit: Mod assist, HOB elevated     General bed mobility comments: pt with difficulty for LUE grasp to bed rail, unable to maintain grasp. Assist required for BLE navigation to EOB, able to push through RUE for upright seated but cannot complete without therapist assist    Transfers Overall transfer level: Needs assistance Equipment used: Rolling walker (2 wheels) Transfers: Sit to/from Stand, Bed to chair/wheelchair/BSC Sit to Stand: Mod assist, +2 physical assistance   Step pivot transfers: Mod assist, +2 physical assistance       General transfer comment: Cues for hand placement but difficulty with maintaing grasp, often lets go of RW during transfer. Increased trunk flexion, able to demo some medial/lateral weight shifting in standing with foot  clearance. 4-5 steps laterally to R side for recliner, poor eccentric control on lowering to seated.    Ambulation/Gait Ambulation/Gait assistance: Mod assist Gait Distance (Feet): 3 Feet Assistive device: Rolling walker (2 wheels) Gait Pattern/deviations: Step-to pattern, Decreased step length - right, Decreased step length - left, Decreased weight shift to right, Decreased weight shift to left, Shuffle, Narrow base of support Gait velocity: reduced Gait velocity interpretation: <1.31 ft/sec, indicative of household ambulator   General Gait Details: Cues for weight shifting, small and shuffling steps, can extend back to upright standing when cued but difficulty with maintaining. Difficulty with grasping and moving the RW.  Stairs            Wheelchair Mobility    Modified Rankin (Stroke Patients Only)       Balance Overall balance assessment: Needs assistance Sitting-balance support: Feet supported, No upper extremity supported Sitting balance-Leahy Scale: Fair     Standing balance support: Bilateral upper extremity supported, During functional activity, Reliant on assistive device for balance Standing balance-Leahy Scale: Poor Standing balance comment:  (Reliant on RW yet intermittently releases grasp and loses balance)                             Pertinent Vitals/Pain Pain Assessment Pain Assessment: Faces Faces Pain Scale: Hurts little more Pain Location: generalized with mobility Pain Descriptors / Indicators: Discomfort, Grimacing Pain Intervention(s): Limited activity within patient's tolerance, Monitored during session    Home Living Family/patient expects to be discharged to:: Private residence Living Arrangements: Alone Available Help at Discharge: Family;Available PRN/intermittently Type of Home: House Home Access: Level entry       Home Layout: One level Home Equipment: Agricultural consultant (2 wheels);Cane - single point      Prior Function  Prior Level of Function : Independent/Modified Independent             Mobility Comments: Tourist information centre manager without AD ADLs Comments: Independent     Hand Dominance   Dominant Hand: Right    Extremity/Trunk Assessment   Upper Extremity Assessment Upper Extremity Assessment: Generalized weakness;Defer to OT evaluation    Lower Extremity Assessment Lower Extremity Assessment: Generalized weakness    Cervical / Trunk Assessment Cervical / Trunk Assessment: Kyphotic  Communication   Communication: No difficulties  Cognition Arousal/Alertness: Awake/alert Behavior During Therapy: WFL for tasks assessed/performed Overall Cognitive Status: Within Functional Limits for tasks assessed                                          General Comments      Exercises     Assessment/Plan    PT Assessment Patient needs continued PT services  PT Problem List Decreased strength;Decreased activity tolerance;Decreased balance;Decreased mobility;Impaired sensation       PT Treatment Interventions DME instruction;Gait training;Stair training;Functional mobility training;Balance training    PT Goals (Current goals can be found in the Care Plan section)  Acute Rehab PT Goals Patient Stated Goal: to start walking PT Goal Formulation: With patient/family Time For Goal Achievement: 05/22/23 Potential to Achieve Goals: Good    Frequency Min 5X/week     Co-evaluation               AM-PAC PT "6 Clicks" Mobility  Outcome Measure Help needed turning from your back to your side while in a flat bed without using  bedrails?: A Little Help needed moving from lying on your back to sitting on the side of a flat bed without using bedrails?: A Lot Help needed moving to and from a bed to a chair (including a wheelchair)?: A Lot Help needed standing up from a chair using your arms (e.g., wheelchair or bedside chair)?: A Lot Help needed to walk in hospital room?: A  Lot Help needed climbing 3-5 steps with a railing? : Total 6 Click Score: 12    End of Session Equipment Utilized During Treatment: Gait belt Activity Tolerance: Patient tolerated treatment well Patient left: in chair;with call bell/phone within reach Nurse Communication: Mobility status PT Visit Diagnosis: Unsteadiness on feet (R26.81);Other abnormalities of gait and mobility (R26.89);Muscle weakness (generalized) (M62.81);Difficulty in walking, not elsewhere classified (R26.2);Other symptoms and signs involving the nervous system (R29.898)    Time: 0950-1020 PT Time Calculation (min) (ACUTE ONLY): 30 min   Charges:   PT Evaluation $PT Re-evaluation: 1 Re-eval PT Treatments $Therapeutic Activity: 8-22 mins        Hendricks Milo, SPT  Acute Rehabilitation Services   Hendricks Milo 05/11/2023, 12:06 PM

## 2023-05-11 NOTE — Progress Notes (Signed)
Patient ID: Kenneth [name], male   DOB: [birth year], 77 y.o.   MRN: [id number] He is not complaining of pain. Collar in place. Moves legs ok. Grips ok, almost 3/5 R delt and R bicep 4-/5, L delt 2/5, bicep 2/5  PT/OT  Mobilize  We are following

## 2023-05-11 NOTE — Plan of Care (Signed)
[facility]  Patient: Kenneth [name] WUJ:[id number]   PCP: Benetta Spar, MD DOB: [birth year]   DOA: 05/07/2023   DOS: 05/11/2023   Appreciate PCCM and neurosurgery assistance. Patient's care is currently being transferred to ICU service. [facility] will sign off. [facility] will be happy to take the pt back on our service when the patient is ready for transfer.  Author: Lynden Oxford, MD [facility] 05/11/2023 8:54 AM

## 2023-05-11 NOTE — Progress Notes (Signed)
Orthopedic Tech Progress Note Patient Details:  Kenneth [name] 1946/09/18 [id number]  Ortho Devices Type of Ortho Device: Soft collar Ortho Device/Splint Location: Neck Ortho Device/Splint Interventions: Ordered      [name] 05/11/2023, 12:33 AM

## 2023-05-11 NOTE — Consult Note (Signed)
NAME:  Kenneth [name], MRN:  [id number], DOB:  [birth year], LOS: 2 ADMISSION DATE:  05/07/2023, CONSULTATION DATE:  05/11/23 REFERRING MD:  TRH, CHIEF COMPLAINT:  weakness   History of Present Illness:  This is a 77 year old male with past medical history of hypertension smoking, former alcohol abuse presenting with worsening facial droop and weakness.  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  Patient underwent ACDF C3-7 with Dr. Franky Macho in the evening of 6/28.  He arrives to 4 N. for postoperative monitoring with a drain in place.  PCCM is consulted to take over for [facility] as the patient is now in the intensive care unit.  Patient currently sitting in chair and denies any pain.  He does have some global weakness stable since the procedure.  He is hungry.   Pertinent  Medical History  Psoriasis Hypertension Tobacco abuse Former alcohol abuse  Significant Hospital Events: Including procedures, antibiotic start and stop dates in addition to other pertinent events   6/25 admission 6/28 ACDF  Interim History / Subjective:  Consulted  Objective   Blood pressure (!) 102/57, pulse 68, temperature 99.5 F (37.5 C), temperature source Oral, resp. rate 14, height 5\' 4"  (1.626 m), weight 63.5 kg, SpO2 98 %.        Intake/Output Summary (Last 24 hours) at 05/11/2023 1035 Last data filed at 05/11/2023 1000 Gross per 24 hour  Intake 2485.75 ml  Output 3175 ml  Net -689.25 ml   Filed Weights   05/07/23 1124  Weight: 63.5 kg    Examination: General: No acute distress HENT: Soft collar in place, drain with small sanguinous output Lungs: Clear to auscultation, no accessory muscle use Cardiovascular: Heart sounds regular, extremities warm Abdomen: Soft, hypoactive bowel sounds Extremities: Has muscle wasting, no edema, positive stigmata of osteoarthritis Neuro: Moves all 4 extremities  command, sensation grossly intact, facial droop noted GU: Foley in place with yellow urine  Resolved Hospital Problem list   N/A  Assessment & Plan:  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 Muscular deconditioning POA Very small left cerebellar stroke  - Antiplatelets per neurology and neurosurgery discussion - Continue statin - PT/OT - Progressive mobility, start diet - Graded pain control - Neurochecks - Event monitor on DC per neurology -Drain management and ICU disposition per neurosurgery  Best Practice (right click and "Reselect all SmartList Selections" daily)   Diet/type: Regular consistency (see orders) DVT prophylaxis: SCD GI prophylaxis: N/A Lines: N/A Foley:  removal ordered  Code Status:  full code Last date of multidisciplinary goals of care discussion [pending]  Labs   CBC: Recent Labs  Lab 05/07/23 1308 05/07/23 1317  WBC 5.9  --   NEUTROABS 3.2  --   HGB 12.9* 14.3  HCT 41.3 42.0  MCV 90.6  --   PLT 289  --     Basic Metabolic Panel: Recent Labs  Lab 05/07/23 1308 05/07/23 1317  NA 137 141  K 4.0 4.2  CL 104 107  CO2 26  --   GLUCOSE 113* 111*  BUN 16 15  CREATININE 1.07 1.10  CALCIUM 8.9  --    GFR: Estimated Creatinine Clearance: 46.3 mL/min (by C-G formula based on SCr of 1.1 mg/dL). Recent Labs  Lab 05/07/23 1308  WBC 5.9    Liver Function Tests: Recent Labs  Lab 05/07/23 1308  AST 25  ALT 16  ALKPHOS 58  BILITOT 1.0  PROT 7.3  ALBUMIN 3.6   No results for input(s): "LIPASE", "AMYLASE" in the last 168 hours. No results for input(s): "AMMONIA" in the last 168 hours.  ABG    Component Value Date/Time   TCO2 26 05/07/2023 1317     Coagulation Profile: Recent Labs  Lab 05/07/23 1308  INR 1.1    Cardiac Enzymes: No results for input(s): "CKTOTAL", "CKMB", "CKMBINDEX", "TROPONINI" in the last 168 hours.  HbA1C: Hgb A1c MFr Bld  Date/Time Value Ref Range Status  05/07/2023 03:01 PM  5.8 (H) 4.8 - 5.6 % Final    Comment:    (NOTE)         Prediabetes: 5.7 - 6.4         Diabetes: >6.4         Glycemic control for adults with diabetes: <7.0     CBG: Recent Labs  Lab 05/07/23 1213  GLUCAP 116*    Review of Systems:    Positive Symptoms in bold:  Constitutional fevers, chills, weight loss, fatigue, anorexia, malaise  Eyes decreased vision, double vision, eye irritation  Ears, Nose, Mouth, Throat sore throat, trouble swallowing, sinus congestion  Cardiovascular chest pain, paroxysmal nocturnal dyspnea, lower ext edema, palpitations   Respiratory SOB, cough, DOE, hemoptysis, wheezing  Gastrointestinal nausea, vomiting, diarrhea  Genitourinary burning with urination, trouble urinating  Musculoskeletal joint aches, joint swelling, back pain  Integumentary  rashes, skin lesions  Neurological focal weakness, focal numbness, trouble speaking, headaches  Psychiatric depression, anxiety, confusion  Endocrine polyuria, polydipsia, cold intolerance, heat intolerance  Hematologic abnormal bruising, abnormal bleeding, unexplained nose bleeds  Allergic/Immunologic recurrent infections, hives, swollen lymph nodes     Past Medical History:  He,  has a past medical history of Hypertension and Psoriasis.   Surgical History:   Past Surgical History:  Procedure Laterality Date   HERNIA REPAIR       Social History:   reports that he has quit smoking. His smoking use included cigarettes. He has never used smokeless tobacco. He reports current alcohol use. He reports that he does not currently use drugs.   Family History:  His family history is not on file.   Allergies No Known Allergies   Home Medications  Prior to Admission medications   Medication Sig Start Date End Date Taking? Authorizing Provider  albuterol (VENTOLIN HFA) 108 (90 Base) MCG/ACT inhaler Inhale 2 puffs into the lungs every 6 (six) hours as needed for shortness of breath. 10/01/22  Yes  [provider]  amLODipine (NORVASC) 5 MG tablet Take 5 mg by mouth daily.   Yes [provider]  atorvastatin (LIPITOR) 10 MG tablet Take 10 mg by mouth at bedtime. 03/26/23  Yes [provider]  furosemide (LASIX) 20 MG tablet Take 1 tablet (20 mg total) by mouth daily. 12/13/22  Yes Benjiman Core, MD  mineral oil-hydrophilic petrolatum (AQUAPHOR) ointment Apply topically as needed for dry skin. Patient taking differently: Apply 1 Application topically as needed for dry skin. 11/28/22  Yes Tanda Rockers A, DO  SKYRIZI PEN 150 MG/ML pen Inject 1 mL into the skin once. 04/05/23  Yes [provider]  cephALEXin (KEFLEX) 500 MG capsule Take 1 capsule (500 mg total) by mouth 2 (two) times daily. Patient not taking: Reported on 05/07/2023 12/13/22   Benjiman Core, MD  doxycycline (VIBRAMYCIN) 100 MG capsule Take 1 capsule (100 mg total) by mouth 2 (two) times daily. Patient not taking: Reported on 05/07/2023 12/13/22   Rubin Payor,  Harrold Donath, MD  predniSONE (DELTASONE) 10 MG tablet Take 10 mg by mouth daily. 6,5,4,3,2,1 taper dose Patient not taking: Reported on 11/28/2022 11/22/22   [provider]     Critical care time: N/A

## 2023-05-12 DIAGNOSIS — I639 Cerebral infarction, unspecified: Secondary | ICD-10-CM | POA: Diagnosis not present

## 2023-05-12 NOTE — Progress Notes (Signed)
Physical Therapy Treatment Patient Details Name: Kenneth [name] MRN: [id number] DOB: 29-Jan-1946 Today's Date: 05/12/2023   History of Present Illness Pt is a 77 y.o. male who presented to [facility] 05/07/23 with L-sided weakness and facial droop. 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 Pt transferred to [facility] 6/26. PMH: HTN, psoriasis. Pt is s/p ACDF C3-7 6/28.    PT Comments    Today's session focused on functional mobility and progression of ambulation using the bilateral platform RW. Pt is making steady progress towards their acute goals. Pt able to initiate BLE navigation, completes EOB dangle with max A x2 person. Pt sit<>stand with mod A, verbal cues for hand placement on the RW. Pt amb to door threshold and back to recliner, totaling 20', benefits greatly from multimodal cues for improved posture. Pt will benefit from intensive inpatient follow up therapy, >3 hours/day, to facilitate improvements in functional mobility, gait training, AD management, and endurance.   Recommendations for follow up therapy are one component of a multi-disciplinary discharge planning process, led by the attending physician.  Recommendations may be updated based on patient status, additional functional criteria and insurance authorization.  Follow Up Recommendations       Assistance Recommended at Discharge Intermittent Supervision/Assistance  Patient can return home with the following A lot of help with walking and/or transfers;A lot of  help with bathing/dressing/bathroom;Assistance with cooking/housework;Assist for transportation;Help with stairs or ramp for entrance   Equipment Recommendations  Other (comment) (TBA)    Recommendations for Other Services       Precautions / Restrictions Precautions Precautions: Fall;Cervical;Other (comment) Precaution Booklet Issued: No Required Braces or Orthoses: Cervical Brace Cervical Brace: Soft collar;Other (comment) (sitting EOB) Restrictions Weight Bearing Restrictions: No     Mobility  Bed Mobility Overal bed mobility: Needs Assistance Bed Mobility: Supine to Sit, Rolling Rolling: +2 for physical assistance, Mod assist   Supine to sit: Max assist, +2 for physical assistance, HOB elevated     General bed mobility comments: Initiates BLE navigation but assist required to complete roll and EOB dangle, attempts to push through RUE for assist in upright seated but requires therapist assist.    Transfers Overall transfer level: Needs assistance Equipment used: Bilateral platform walker Transfers: Sit to/from Stand Sit to Stand: Mod assist, +2 physical assistance           General transfer comment: Slow to rise, cues for hand placement on new platform walker, benefits from cues for upright stand    Ambulation/Gait   Gait Distance (Feet): 20 Feet Assistive device: Bilateral platform walker Gait Pattern/deviations: Step-to pattern, Decreased step length - right, Decreased step length - left, Decreased weight shift to right, Decreased weight shift to left, Shuffle, Narrow base of support Gait velocity: reduced Gait velocity interpretation: <1.31 ft/sec, indicative of household ambulator   General Gait Details: Slow pace, intermittent midline crossing of feet but correctable through cues. Pt tends to flex neck to look at floor, benefits from postural cues. Manual facilitation for progression of platform RW required for turning.   Stairs  Wheelchair Mobility    Modified Rankin (Stroke Patients Only) Modified Rankin (Stroke Patients Only) Pre-Morbid Rankin Score: No symptoms Modified Rankin: Moderately severe disability     Balance Overall balance assessment: Needs assistance Sitting-balance support: Feet supported, No upper extremity supported Sitting balance-Leahy Scale: Fair     Standing balance support: Bilateral upper extremity supported, During functional activity, Reliant on assistive device for balance Standing balance-Leahy Scale: Poor Standing balance comment: reliant on platform RW                            Cognition Arousal/Alertness: Awake/alert Behavior During Therapy: WFL for tasks assessed/performed Overall Cognitive Status: Within Functional Limits for tasks assessed                                 General Comments: Pt reports some level of baseline memory deficits        Exercises      General Comments        Pertinent Vitals/Pain Pain Assessment Pain Assessment: No/denies pain    Home Living                          Prior Function            PT Goals (current goals can now be found in the care plan section) Acute Rehab PT Goals Patient Stated Goal: start moving more PT Goal Formulation: With patient/family Time For Goal Achievement: 05/22/23 Potential to Achieve Goals: Good Progress towards PT goals: Progressing toward goals    Frequency    Min 5X/week      PT Plan Current plan remains appropriate    Co-evaluation              AM-PAC PT "6 Clicks" Mobility   Outcome Measure  Help needed turning from your back to your side while in a flat bed without using bedrails?: A Lot Help needed moving from lying on your back to sitting on the side of a flat bed without using bedrails?: A Lot Help needed moving to and from a bed to a chair (including a wheelchair)?: A Lot Help needed standing up from a chair using your arms  (e.g., wheelchair or bedside chair)?: A Lot Help needed to walk in hospital room?: A Lot Help needed climbing 3-5 steps with a railing? : Total 6 Click Score: 11    End of Session Equipment Utilized During Treatment: Gait belt Activity Tolerance: Patient tolerated treatment well Patient left: in chair;with call bell/phone within reach Nurse Communication: Mobility status PT Visit Diagnosis: Unsteadiness on feet (R26.81);Other abnormalities of gait and mobility (R26.89);Muscle weakness (generalized) (M62.81);Difficulty in walking, not elsewhere classified (R26.2);Other symptoms and signs involving the nervous system (R29.898)     Time: 1635-1700 PT Time Calculation (min) (ACUTE ONLY): 25 min  Charges:  $Gait Training: 8-22 mins $Therapeutic Activity: 8-22 mins                     Hendricks Milo, SPT  Acute Rehabilitation Services    Hendricks Milo 05/12/2023, 6:25 PM

## 2023-05-12 NOTE — Anesthesia Postprocedure Evaluation (Signed)
Anesthesia Post Note  Patient: Kenneth [name]  Procedure(s) Performed: Cervical three to Cervical seven Anterior Cervical Decompression Fusion (Spine Cervical)     Patient location during evaluation: PACU Anesthesia Type: General Level of consciousness: awake and alert Pain management: pain level controlled Vital Signs Assessment: post-procedure vital signs reviewed and stable Respiratory status: spontaneous breathing, nonlabored ventilation, respiratory function stable and patient connected to nasal cannula oxygen Cardiovascular status: blood pressure returned to baseline and stable Postop Assessment: no apparent nausea or vomiting Anesthetic complications: no   No notable events documented.  Last Vitals:  Vitals:   05/12/23 0700 05/12/23 0800  BP: (!) 149/85   Pulse: (!) 55 (!) 53  Resp: (!) 5 14  Temp:    SpO2: 95% 97%    Last Pain:  Vitals:   05/12/23 0600  TempSrc:   PainSc: 0-No pain                 Earl Lites P Cambre Matson

## 2023-05-12 NOTE — Progress Notes (Signed)
NEUROSURGERY PROGRESS NOTE  No acute events overnight. S/p 4 level ACDF. Strength stable. Continue therapy and mobilizing   Temp:  [97.9 F (36.6 C)-98.5 F (36.9 C)] 98.5 F (36.9 C) (06/30 0400) Pulse Rate:  [53-92] 53 (06/30 0800) Resp:  [5-21] 14 (06/30 0800) BP: (105-174)/(56-97) 149/85 (06/30 0700) SpO2:  [91 %-100 %] 97 % (06/30 0800)   Sherryl Manges, NP 05/12/2023 9:50 AM

## 2023-05-12 NOTE — Progress Notes (Addendum)
   NAME:  Kenneth [name], MRN:  [id number], DOB:  05-Oct-1946, LOS: 3 ADMISSION DATE:  05/07/2023, CONSULTATION DATE:  05/11/23 REFERRING MD:  TRH, CHIEF COMPLAINT:  weakness   History of Present Illness:  This is a 76 year old male with past medical history of hypertension smoking, former alcohol abuse presenting with worsening facial droop and weakness.  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  Patient underwent ACDF C3-7 with Dr. Franky Macho in the evening of 6/28.  He arrives to 4 N. for postoperative monitoring with a drain in place.  PCCM is consulted to take over for [facility] as the patient is now in the intensive care unit.  Patient currently sitting in chair and denies any pain.  He does have some global weakness stable since the procedure.  He is hungry.   Pertinent  Medical History  Psoriasis Hypertension Tobacco abuse Former alcohol abuse  Significant Hospital Events: Including procedures, antibiotic start and stop dates in addition to other pertinent events   6/25 admission 6/28 ACDF  Interim History / Subjective:  No events, doing okay, eating well.  Objective   Blood pressure (!) 149/85, pulse (!) 55, temperature 98.5 F (36.9 C), temperature source Oral, resp. rate (!) 5, height 5\' 4"  (1.626 m), weight 63.5 kg, SpO2 95 %.        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 Muscular deconditioning POA Very small left cerebellar  stroke  - Antiplatelets, steroids per neurology and neurosurgery discussion - Continue statin - PT/OT - Progressive mobility, encourage PO - Graded pain control - Neurochecks - Event monitor on discharge per neurology -Drain management and ICU disposition per neurosurgery - Will see if TRH can take back over starting tomorrow AM, really not doing anything from a critical care standpoint  Best Practice (right click and "Reselect all SmartList Selections" daily)   Diet/type: Regular consistency (see orders) DVT prophylaxis: SCD GI prophylaxis: N/A Lines: N/A Foley:  removal ordered  Code Status:  full code Last date of multidisciplinary goals of care discussion [pending]  Myrla Halsted MD PCCM

## 2023-05-13 DIAGNOSIS — I639 Cerebral infarction, unspecified: Secondary | ICD-10-CM | POA: Diagnosis not present

## 2023-05-13 MED ORDER — CLOPIDOGREL BISULFATE 75 MG PO TABS
75.0000 mg | ORAL_TABLET | Freq: Every day | ORAL | Status: DC
  Administered 2023-05-13 – 2023-05-15 (×3): 75 mg via ORAL
  Filled 2023-05-13 (×3): qty 1

## 2023-05-13 MED ORDER — TRAZODONE HCL 50 MG PO TABS
50.0000 mg | ORAL_TABLET | Freq: Every evening | ORAL | Status: DC | PRN
  Administered 2023-05-14: 50 mg via ORAL
  Filled 2023-05-13: qty 1

## 2023-05-13 MED ORDER — IPRATROPIUM-ALBUTEROL 0.5-2.5 (3) MG/3ML IN SOLN: 3.0000 mL | RESPIRATORY_TRACT | Status: DC | PRN

## 2023-05-13 MED ORDER — AMLODIPINE BESYLATE 5 MG PO TABS
5.0000 mg | ORAL_TABLET | Freq: Every day | ORAL | Status: DC
  Administered 2023-05-13 – 2023-05-14 (×2): 5 mg via ORAL
  Filled 2023-05-13 (×3): qty 1

## 2023-05-13 MED ORDER — GUAIFENESIN 100 MG/5ML PO LIQD: 5.0000 mL | ORAL | Status: DC | PRN

## 2023-05-13 MED ORDER — HYDRALAZINE HCL 20 MG/ML IJ SOLN: 10.0000 mg | INTRAMUSCULAR | Status: DC | PRN

## 2023-05-13 MED ORDER — METOPROLOL TARTRATE 5 MG/5ML IV SOLN: 5.0000 mg | INTRAVENOUS | Status: DC | PRN

## 2023-05-13 NOTE — Progress Notes (Signed)
PROGRESS NOTE    Kenneth [name]  ZOX:[id number] DOB: [birth year] DOA: 05/07/2023 PCP: Benetta Spar, MD   Brief Narrative:  77 year old male with past medical history of hypertension smoking, former alcohol abuse presenting with worsening facial droop and weakness. 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 Patient underwent ACDF C3-7 with Dr. Franky Macho in the evening of 6/28. He arrives to 4 N. for postoperative monitoring with a drain in place. PCCM is consulted to take over for [facility] as the patient is now in the intensive care unit. Eventually Post Op transferred back to [facility] on 7/1.    Assessment & Plan:  Principal Problem:   Acute ischemic stroke [facility]) Active Problems:   Lower extremity edema   Psoriasis   Edentulous   Former smoker   Former alcohol abuser   Lives alone   Gait instability   Left-sided weakness   Failure to thrive in adult   Facial droop   Mixed hyperlipidemia   CVA (cerebral vascular accident) (HCC)   Stroke (HCC)   Severe cervical disease status post myelopathy - Status post ACDF.  Postop management by surgery team.   Acute left cerebellar stroke. -Previous provider discussed case with neurology.  Recommending aspirin and Plavix for 3 weeks followed by aspirin.  PT/OT CIR. LDL 69. Echo normal. Will message cardiology for 30 day monitor   Right lower leg ulcer.  Prior history of left leg ulcer. Prior history of left leg ulcer. -ABI and x-ray of the foot unremarkable.  Follow-up outpatient   Essential hypertension. -Norvasc  IV as needed.   HLD. -Daily Lipitor   History of psoriasis. -Continue home meds  COPD. -As needed bronchodilators   DVT prophylaxis: Place and maintain sequential compression device Start: 05/11/23 1126 SCD's Start: 05/11/23 0052 Code Status: Full Family Communication:   Needs CIR       Diet Orders (From  admission, onward)     Start     Ordered   05/11/23 0957  Diet regular Room service appropriate? Yes; Fluid consistency: Thin  Diet effective now       Question Answer Comment  Room service appropriate? Yes   Fluid consistency: Thin      05/11/23 0957            Subjective: No complaints. Feels ok   Examination:  General exam: Appears calm and comfortable  Respiratory system: Clear to auscultation. Respiratory effort normal. Cardiovascular system: S1 & S2 heard, RRR. No JVD, murmurs, rubs, gallops or clicks. No pedal edema. Gastrointestinal system: Abdomen is nondistended, soft and nontender. No organomegaly or masses felt. Normal bowel sounds heard. Central nervous system: Alert and oriented. No focal neurological deficits. Extremities: Symmetric 5 x 5 power. Skin: No rashes, lesions or ulcers Psychiatry: Judgement and insight appear normal. Mood & affect appropriate.  Objective: Vitals:   05/13/23 0745 05/13/23 0747 05/13/23 0748 05/13/23 0800  BP: 130/75   119/68  Pulse: 66   69  Resp: 15   15  Temp:    98.2 F (36.8 C)  TempSrc:    Oral  SpO2: 95% 95% 95% 98%  Weight:      Height:        Intake/Output Summary (Last 24 hours) at 05/13/2023 0848 Last data filed at 05/13/2023 0800 Gross per 24 hour  Intake 2736.54 ml  Output 2835 ml  Net -98.46 ml   Filed Weights   05/07/23 1124  Weight: 63.5 kg    Scheduled Meds:  arformoterol  15 mcg Nebulization BID   aspirin EC  81 mg Oral Daily   atorvastatin  10 mg Oral Daily   B-complex with vitamin C  1 tablet Oral Daily   budesonide (PULMICORT) nebulizer solution  0.5 mg Nebulization BID   Chlorhexidine Gluconate Cloth  6 each Topical Q0600   vitamin B-12  1,000 mcg Oral Daily   dexamethasone (DECADRON) injection  4 mg Intravenous Q8H   famotidine  20 mg Oral Daily   ipratropium-albuterol  3 mL Nebulization BID   mupirocin ointment  1 Application Nasal BID   mouth rinse  15 mL Mouth Rinse 4 times per day    senna  1 tablet Oral BID   sodium chloride flush  3 mL Intravenous Q12H   Vitamin D (Ergocalciferol)  50,000 Units Oral Q7 days   Continuous Infusions:  sodium chloride 5 mL/hr at 05/13/23 0800   0.9 % NaCl with KCl 20 mEq / L 80 mL/hr at 05/13/23 0800    Nutritional status     Body mass index is 24.03 kg/m.  Data Reviewed:   CBC: Recent Labs  Lab 05/07/23 1308 05/07/23 1317  WBC 5.9  --   NEUTROABS 3.2  --   HGB 12.9* 14.3  HCT 41.3 42.0  MCV 90.6  --   PLT 289  --    Basic Metabolic Panel: Recent Labs  Lab 05/07/23 1308 05/07/23 1317  NA 137 141  K 4.0 4.2  CL 104 107  CO2 26  --   GLUCOSE 113* 111*  BUN 16 15  CREATININE 1.07 1.10  CALCIUM 8.9  --    GFR: Estimated Creatinine Clearance: 46.3 mL/min (by C-G formula based on SCr of 1.1 mg/dL). Liver Function Tests: Recent Labs  Lab 05/07/23 1308  AST 25  ALT 16  ALKPHOS 58  BILITOT 1.0  PROT 7.3  ALBUMIN 3.6   No results for input(s): "LIPASE", "AMYLASE" in the last 168 hours. No results for input(s): "AMMONIA" in the last 168 hours. Coagulation Profile: Recent Labs  Lab 05/07/23 1308  INR 1.1   Cardiac Enzymes: No results for input(s): "CKTOTAL", "CKMB", "CKMBINDEX", "TROPONINI" in the last 168 hours. BNP (last 3 results) No results for input(s): "PROBNP" in the last 8760 hours. HbA1C: No results for input(s): "HGBA1C" in the last 72 hours. CBG: Recent Labs  Lab 05/07/23 1213  GLUCAP 116*   Lipid Profile: No results for input(s): "CHOL", "HDL", "LDLCALC", "TRIG", "CHOLHDL", "LDLDIRECT" in the last 72 hours. Thyroid Function Tests: No results for input(s): "TSH", "T4TOTAL", "FREET4", "T3FREE", "THYROIDAB" in the last 72 hours. Anemia Panel: No results for input(s): "VITAMINB12", "FOLATE", "FERRITIN", "TIBC", "IRON", "RETICCTPCT" in the last 72 hours. Sepsis Labs: No results for input(s): "PROCALCITON", "LATICACIDVEN" in the last 168 hours.  Recent Results (from the past 240  hour(s))  Surgical pcr screen     Status: Abnormal   Collection Time: 05/10/23  4:08 PM   Specimen: Nasal Mucosa; Nasal Swab  Result Value Ref Range Status   MRSA, PCR POSITIVE (A) NEGATIVE Final    Comment: RESULT CALLED TO, READ BACK BY AND VERIFIED WITH: RN LANCEY CLARK ON 05/10/23 @ 1730 BY DRT    Staphylococcus aureus POSITIVE (A) NEGATIVE Final    Comment: (NOTE) The Xpert SA Assay (FDA approved for NASAL specimens in patients 39 years of age and older), is one component of a comprehensive surveillance program. It is not intended to diagnose  infection nor to guide or monitor treatment. Performed at [facility], [address] [place], [place] [address]          Radiology Studies: No results found.         LOS: 4 days   Time spent= 35 mins    Niranjan Rufener Joline Maxcy, MD [facility]  If 7PM-7AM, please contact night-coverage  05/13/2023, 8:48 AM

## 2023-05-13 NOTE — Progress Notes (Signed)
Patient ID: Kenneth [name], male   DOB: 05-31-1946, 77 y.o.   MRN: [id number] BP 113/60   Pulse 69   Temp 98.5 F (36.9 C) (Oral)   Resp 13   Ht 5\' 4"  (1.626 m)   Wt 63.5 kg   SpO2 93%   BMI 24.03 kg/m  Alert, oriented x4, speech is clear and fluent Moving all extremities, better use of hands Wound is clean, dry, no signs of infection Continue with rehab, hope cir

## 2023-05-13 NOTE — Progress Notes (Signed)
Occupational Therapy Treatment Patient Details Name: Kenneth [name] MRN: [id number] DOB: 08/04/1946 Today's Date: 05/13/2023   History of present illness Pt is a 77 y.o. male who presented to [facility] 05/07/23 with L-sided weakness and facial droop. 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 Pt transferred to [facility] 6/26. PMH: HTN, psoriasis. Pt is s/p ACDF C3-7 6/28.   OT comments  Patient reassessed post ACDF 6/28.  Patient continues with upper body weakness and poor bilateral gross and fine motor coordination, poor dynamic balance, and decreased weakness and coordination to lower extremities.  Patient currently needing up to Max A for lower body ADL seated, and Mod A for basic transfers.  Patient remains very motivated to regain his independence, and demonstrates the ability to potentially achieve a Mod I level with Patient will benefit from intensive inpatient follow up therapy, >3 hours/day.  OT will continue to follow in the acute setting to address deficits.      Recommendations for follow up therapy are one component of a multi-disciplinary discharge planning process, led by the attending physician.  Recommendations may be updated based on patient status, additional functional criteria and insurance authorization.    Assistance Recommended at Discharge Frequent or constant Supervision/Assistance  Patient can return home with the following  A lot of help with walking and/or transfers;A lot of help with  bathing/dressing/bathroom;Assistance with cooking/housework;Assistance with feeding;Assist for transportation;Help with stairs or ramp for entrance   Equipment Recommendations  None recommended by OT    Recommendations for Other Services      Precautions / Restrictions Precautions Precautions: Fall;Cervical;Other (comment) Precaution Booklet Issued: No Precaution Comments: Reviewed with patient verbalizing understanding Required Braces or Orthoses: Cervical Brace Cervical Brace: Soft collar;For comfort Restrictions Weight Bearing Restrictions: No       Mobility Bed Mobility Overal bed mobility: Needs Assistance Bed Mobility: Supine to Sit Rolling: Mod assist              Transfers Overall transfer level: Needs assistance Equipment used: Bilateral platform walker Transfers: Sit to/from Stand Sit to Stand: Mod assist     Step pivot transfers: Mod assist           Balance Overall balance assessment: Needs assistance Sitting-balance support: Feet supported, No upper extremity supported Sitting balance-Leahy Scale: Fair     Standing balance support: Bilateral upper extremity supported, During functional activity, Reliant on assistive device for balance Standing balance-Leahy Scale: Poor                             ADL either performed or assessed with clinical judgement   ADL   Eating/Feeding: Minimal assistance;Sitting   Grooming: Wash/dry hands;Wash/dry face;Oral care;Minimal assistance;Sitting   Upper Body Bathing: Maximal assistance;Sitting;Moderate assistance   Lower Body Bathing: Maximal assistance;Sitting/lateral leans   Upper Body Dressing : Moderate assistance;Sitting   Lower Body Dressing: Maximal assistance;Sitting/lateral leans   Toilet Transfer: Moderate assistance;Rolling walker (2 wheels);Comfort height toilet;Ambulation   Toileting- Clothing Manipulation and Hygiene: Maximal assistance;Bed level  Extremity/Trunk Assessment Upper Extremity Assessment Upper Extremity Assessment: RUE deficits/detail RUE Deficits / Details: 2-/5 shoulder flexion; 2-/5 elbow flexion; 2-/5 elbow extension; 3+/5 wrist extension; 1/5 supination; 1/5 wrist flexion. 3+/5 composit grip. RUE Sensation: decreased light touch RUE Coordination: decreased fine motor;decreased gross motor LUE Deficits / Details: 2-/5 shoulder flexion; 2-/5 elbow flexion; 2-/5 elbow extension; 3+/5 wrist extension; 1/5 supination; 1/5 wrist flexion. 3+/5 composit grip. LUE Sensation: decreased light touch LUE Coordination: decreased fine motor;decreased gross motor   Lower Extremity Assessment Lower Extremity Assessment: Defer to PT evaluation   Cervical / Trunk Assessment Cervical / Trunk Assessment: Neck Surgery    Vision Patient Visual Report: No change from baseline     Perception Perception Perception: Within Functional Limits   Praxis      Cognition Arousal/Alertness: Awake/alert Behavior During Therapy: WFL for tasks assessed/performed Overall Cognitive Status: Within Functional Limits for tasks assessed                                          Exercises      Shoulder Instructions       General Comments      Pertinent Vitals/ Pain       Pain Assessment Pain Assessment: No/denies pain Pain Intervention(s): Monitored during session                                                          Frequency  Min 2X/week        Progress Toward Goals  OT Goals(current goals can now be found in the care plan section)  Progress towards OT goals: Progressing toward goals  Acute Rehab OT Goals OT Goal Formulation: With patient Time For Goal Achievement: 05/22/23 Potential to Achieve Goals: Good ADL Goals Pt Will Perform Eating: with adaptive utensils;sitting;with set-up Pt Will Perform Grooming: with set-up;sitting Pt Will Perform Upper Body Bathing: with  set-up;sitting Pt Will Perform Lower Body Bathing: with min assist;sitting/lateral leans Pt Will Perform Upper Body Dressing: with set-up;sitting Pt Will Transfer to Toilet: with min guard assist;ambulating;regular height toilet Pt/caregiver will Perform Home Exercise Program: Increased ROM;Increased strength;Both right and left upper extremity;With minimal assist  Plan Discharge plan remains appropriate    Co-evaluation                 AM-PAC OT "6 Clicks" Daily Activity     Outcome Measure   Help from another person eating meals?: A Little Help from another person taking care of personal grooming?: A Little Help from another person toileting, which includes using toliet, bedpan, or urinal?: A Lot Help from another person bathing (including washing, rinsing, drying)?: A Lot Help from another person to put on and taking off regular upper body clothing?: A Lot Help from another person to put on and taking off regular lower body clothing?: A Lot 6 Click Score: 14    End of Session Equipment Utilized During Treatment: Rolling walker (2 wheels)  OT Visit Diagnosis: Unsteadiness on feet (R26.81);Other abnormalities of gait and mobility (R26.89);History of falling (Z91.81);Ataxia, unspecified (R27.0);Other symptoms and signs involving the nervous system (R29.898);Hemiplegia and hemiparesis Hemiplegia - Right/Left: Left Hemiplegia - dominant/non-dominant: Non-Dominant Hemiplegia - caused by: Cerebral infarction   Activity Tolerance Patient  tolerated treatment well   Patient Left in chair;with call bell/phone within reach;with chair alarm set   Nurse Communication Mobility status        Time: 1610-9604 OT Time Calculation (min): 22 min  Charges: OT General Charges $OT Visit: 1 Visit OT Evaluation $OT Re-eval: 1 Re-eval  05/13/2023  RP, OTR/L  Acute Rehabilitation Services  Office:  [phone]   Suzanna Obey 05/13/2023, 10:31 AM

## 2023-05-13 NOTE — TOC Progression Note (Signed)
Transition of Care [facility]) - Progression Note    Patient Details  Name: Kenneth [name] MRN: [id number] Date of Birth: Apr 10, 1946  [facility] of [facility] CM/SW Contact  Mearl Latin, LCSW Phone Number: 05/13/2023, 5:15 PM  Clinical Narrative:    Patient requesting SNF for rehab rather than CIR with preference for [facility]. CSW left voicemail for [facility] to review referral.    Expected Discharge Plan: Skilled Nursing Facility Barriers to Discharge: Continued Medical Work up, SNF Pending bed offer, English as a second language teacher  Expected Discharge Plan and Services In-house Referral: Clinical Social Work Discharge Planning Services: [employer] Consult Post Acute Care Choice: Skilled Nursing Facility Living arrangements for the past 2 months: Single Family Home                                       Social Determinants of Health (SDOH) Interventions SDOH Screenings   Food Insecurity: No Food Insecurity (05/07/2023)  Housing: Low Risk  (05/07/2023)  Transportation Needs: No Transportation Needs (05/07/2023)  Utilities: Not At Risk (05/07/2023)  Tobacco Use: Medium Risk (05/10/2023)    Readmission Risk Interventions     No data to display

## 2023-05-13 NOTE — Progress Notes (Signed)
Inpatient Rehab Admissions Coordinator:    I met with Pt. For further discussions regarding CIR now that his surgery is complete. Pt. States that he lives alone and that while family can help in the evenings, they all work and he could not have someone with him at all times after CIR. Pt. States that he would prefer to go to [facility] as they have staff there that can always be with him to assist as needed. CIR will sign off and I will notify TOC.   Megan Salon, MS, CCC-SLP Rehab Admissions Coordinator  [phone] (celll) [phone] (office)

## 2023-05-13 NOTE — Progress Notes (Signed)
Pt's nurse checked w/ Chaplain to ensure pt's Advance Care Directive could be notarized by 8:30am tomorrow. Chaplain advised that was not likely as day Chaplains and notary do not report until 8:30 which would mean that would not likely happen until later in the AM.  Chaplain checked to see if a Spiritual Consult had been placed. Finding it hadn't, pt's nurse agreed to place a Spiritual Consult requesting a Notary. Chaplain agreed to place a note in the [place] office requesting a notary as soon as possible on the morrow.   Vernell Morgans Chaplain

## 2023-05-13 NOTE — Progress Notes (Signed)
   05/13/23 1636  Spiritual Encounters  Type of Visit Initial  Care provided to: Patient  Referral source Clinical staff  Reason for visit Advance directives   Mirna Mires responded to a request to provide the form for Advance Care Directive (ACD). Pt received education about what was needed for filling the form. Pt mentioned his relative to come this afternoon and they will discuss the information to be included in the form. Chap asked Pt to call again the [facility] when form is ready to be notarized. Mirna Mires remains available if needed.

## 2023-05-13 NOTE — [place] FL2 (Signed)
[place] MEDICAID FL2 LEVEL OF CARE FORM     IDENTIFICATION  Patient Name: Bosco Brathwaite Birthdate: Mar 27, 1946 Sex: male Admission Date (Current Location): 05/07/2023  [facility] and [place] Number:  [employer] and Address:  [facility] [place], [address] [place], [place] [address]      Provider Number: [id number]  Attending Physician Name and Address:  Dimple Nanas, MD  Relative Name and Phone Number:       Current Level of Care: Hospital Recommended Level of Care: Skilled Nursing Facility Prior Approval Number:    Date Approved/Denied:   PASRR Number: [id number]  Discharge Plan: SNF    Current Diagnoses: Patient Active Problem List   Diagnosis Date Noted   Stroke [facility]) 05/11/2023   CVA (cerebral vascular accident) (HCC) 05/09/2023   Mixed hyperlipidemia 05/08/2023   Acute ischemic stroke (HCC) 05/07/2023   Psoriasis 05/07/2023   Edentulous 05/07/2023   Former smoker 05/07/2023   Former alcohol abuser 05/07/2023   Lives alone 05/07/2023   Gait instability 05/07/2023   Left-sided weakness 05/07/2023   Failure to thrive in adult 05/07/2023   Facial droop 05/07/2023   Lower extremity edema 12/13/2022   Skin rash 12/13/2022    Orientation RESPIRATION BLADDER Height & Weight     Self, Time, Situation, Place  Normal Continent, External catheter Weight: 140 lb (63.5 kg) Height:  5\' 4"  (162.6 cm)  BEHAVIORAL SYMPTOMS/MOOD NEUROLOGICAL BOWEL NUTRITION STATUS      Continent Diet (See dc summary)  AMBULATORY STATUS COMMUNICATION OF NEEDS Skin   Limited Assist Verbally Surgical wounds, Other (Comment) (Closed incision on neck; wound on foot w/foam dressing;)                       Personal Care Assistance Level of Assistance  Bathing, Feeding, Dressing Bathing Assistance: Limited assistance Feeding assistance: Independent Dressing Assistance: Limited assistance     Functional Limitations Info             SPECIAL  CARE FACTORS FREQUENCY  PT (By licensed PT), OT (By licensed OT)     PT Frequency: 5x/week OT Frequency: 5x/week            Contractures Contractures Info: Not present    Additional Factors Info  Code Status, Allergies Code Status Info: Ful Allergies Info: NKA           Current Medications (05/13/2023):  This is the current hospital active medication list Current Facility-Administered Medications  Medication Dose Route Frequency Provider Last Rate Last Admin   0.9 %  sodium chloride infusion  250 mL Intravenous Continuous Coletta Memos, MD 5 mL/hr at 05/13/23 1100 Infusion Verify at 05/13/23 1100   0.9 % NaCl with KCl 20 mEq/ L  infusion   Intravenous Continuous Coletta Memos, MD 80 mL/hr at 05/13/23 1458 Started During Downtime at 05/13/23 1458   acetaminophen (TYLENOL) tablet 650 mg  650 mg Oral Q4H PRN Coletta Memos, MD       Or   acetaminophen (TYLENOL) 160 MG/5ML solution 650 mg  650 mg Per Tube Q4H PRN Coletta Memos, MD       Or   acetaminophen (TYLENOL) suppository 650 mg  650 mg Rectal Q4H PRN Coletta Memos, MD       amLODipine (NORVASC) tablet 5 mg  5 mg Oral Daily Amin, Ankit Chirag, MD   5 mg at 05/13/23 0919   arformoterol (BROVANA) nebulizer solution 15 mcg  15 mcg Nebulization BID  Coletta Memos, MD   15 mcg at 05/13/23 4098   aspirin EC tablet 81 mg  81 mg Oral Daily Coletta Memos, MD   81 mg at 05/13/23 0914   atorvastatin (LIPITOR) tablet 10 mg  10 mg Oral Daily Coletta Memos, MD   10 mg at 05/13/23 1191   B-complex with vitamin C tablet 1 tablet  1 tablet Oral Daily Coletta Memos, MD   1 tablet at 05/13/23 0914   bisacodyl (DULCOLAX) EC tablet 5 mg  5 mg Oral Daily PRN Coletta Memos, MD       budesonide (PULMICORT) nebulizer solution 0.5 mg  0.5 mg Nebulization BID Coletta Memos, MD   0.5 mg at 05/13/23 0747   Chlorhexidine Gluconate Cloth 2 % PADS 6 each  6 each Topical Q0600 Coletta Memos, MD   6 each at 05/13/23 0916   clopidogrel (PLAVIX) tablet 75 mg  75 mg  Oral Daily Coletta Memos, MD   75 mg at 05/13/23 1457   cyanocobalamin (VITAMIN B12) tablet 1,000 mcg  1,000 mcg Oral Daily Coletta Memos, MD   1,000 mcg at 05/13/23 0914   diazepam (VALIUM) tablet 5 mg  5 mg Oral Q6H PRN Coletta Memos, MD       famotidine (PEPCID) tablet 20 mg  20 mg Oral Daily Coletta Memos, MD   20 mg at 05/13/23 0914   guaiFENesin (ROBITUSSIN) 100 MG/5ML liquid 5 mL  5 mL Oral Q4H PRN Amin, Ankit Chirag, MD       hydrALAZINE (APRESOLINE) injection 10 mg  10 mg Intravenous Q4H PRN Amin, Ankit Chirag, MD       ipratropium-albuterol (DUONEB) 0.5-2.5 (3) MG/3ML nebulizer solution 3 mL  3 mL Nebulization BID Coletta Memos, MD   3 mL at 05/13/23 0747   ipratropium-albuterol (DUONEB) 0.5-2.5 (3) MG/3ML nebulizer solution 3 mL  3 mL Nebulization Q4H PRN Amin, Ankit Chirag, MD       magnesium citrate solution 1 Bottle  1 Bottle Oral Once PRN Coletta Memos, MD       menthol-cetylpyridinium (CEPACOL) lozenge 3 mg  1 lozenge Oral PRN Coletta Memos, MD       Or   phenol (CHLORASEPTIC) mouth spray 1 spray  1 spray Mouth/Throat PRN Coletta Memos, MD       metoprolol tartrate (LOPRESSOR) injection 5 mg  5 mg Intravenous Q4H PRN Amin, Ankit Chirag, MD       morphine (PF) 2 MG/ML injection 2 mg  2 mg Intravenous Q2H PRN Coletta Memos, MD   2 mg at 05/11/23 0302   mupirocin ointment (BACTROBAN) 2 % 1 Application  1 Application Nasal BID Coletta Memos, MD   1 Application at 05/13/23 0914   naphazoline-glycerin (CLEAR EYES REDNESS) ophth solution 1-2 drop  1-2 drop Left Eye QID PRN Coletta Memos, MD   1 drop at 05/13/23 0914   ondansetron (ZOFRAN) tablet 4 mg  4 mg Oral Q6H PRN Coletta Memos, MD       Or   ondansetron (ZOFRAN) injection 4 mg  4 mg Intravenous Q6H PRN Coletta Memos, MD       Oral care mouth rinse  15 mL Mouth Rinse 4 times per day Coletta Memos, MD   15 mL at 05/13/23 1144   oxyCODONE (Oxy IR/ROXICODONE) immediate release tablet 10 mg  10 mg Oral Q3H PRN Coletta Memos, MD        oxyCODONE (Oxy IR/ROXICODONE) immediate release tablet 5 mg  5 mg Oral Q3H PRN Coletta Memos,  MD   5 mg at 05/13/23 0603   senna (SENOKOT) tablet 8.6 mg  1 tablet Oral BID Coletta Memos, MD   8.6 mg at 05/13/23 0919   senna-docusate (Senokot-S) tablet 1 tablet  1 tablet Oral QHS PRN Coletta Memos, MD       sodium chloride flush (NS) 0.9 % injection 3 mL  3 mL Intravenous Q12H Coletta Memos, MD   3 mL at 05/13/23 0916   sodium chloride flush (NS) 0.9 % injection 3 mL  3 mL Intravenous PRN Coletta Memos, MD       traZODone (DESYREL) tablet 50 mg  50 mg Oral QHS PRN Amin, Ankit Chirag, MD       Vitamin D (Ergocalciferol) (DRISDOL) 1.25 MG (50000 UNIT) capsule 50,000 Units  50,000 Units Oral Q7 days Coletta Memos, MD   50,000 Units at 05/09/23 1239     Discharge Medications: Please see discharge summary for a list of discharge medications.  Relevant Imaging Results:  Relevant Lab Results:   Additional Information SSN: [id number] [place], [place]

## 2023-05-13 NOTE — Progress Notes (Signed)
Physical Therapy Treatment Patient Details Name: Kenneth [name] MRN: [id number] DOB: Aug 15, 1946 Today's Date: 05/13/2023   History of Present Illness Pt is a 77 y.o. male who [facility] to [facility]07[facility]23 with L-sided weakness and facial droop. 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 Pt transferred to [facility] 6/26. PMH: HTN, psoriasis. Pt is s/p ACDF C3-7 6/28.    PT Comments  Session today focused on progression of gait training, AD management, and functional endurance. Pt with mod A for sit<>stand to the bilateral platform RW, increased time for steadying and cues for hand placement. Pt with improved distance of amb today, 30' total with hallway ambulation and 2 standing rest breaks. Pt continues to show improved upright posture with amb and static standing, still greatly benefits from cueing. Pt will continue to benefit from skilled PT during their admission to further progress gait training and functional endurance.    Assistance Recommended at Discharge Intermittent Supervision/Assistance  If plan is discharge home, recommend the following:  Can travel by private vehicle    A lot of help with walking and/or transfers;A lot of help with bathing/dressing/bathroom;Assistance with cooking/housework;Assist for transportation;Help with stairs or ramp for entrance   Yes  Equipment Recommendations  Other (comment)    Recommendations for Other Services       Precautions / Restrictions Precautions Precautions:  Fall;Cervical;Other (comment) Precaution Booklet Issued: No Precaution Comments: Reviewed with patient verbalizing understanding Required Braces or Orthoses: Cervical Brace Cervical Brace: Soft collar;For comfort Restrictions Weight Bearing Restrictions: No     Mobility  Bed Mobility Overal bed mobility: Needs Assistance         Sit to supine: Max assist   General bed mobility comments: Pt seated in recliner at beginning of session. Max A to navigate BLE back into bed on end of session.    Transfers Overall transfer level: Needs assistance Equipment used: Bilateral platform walker Transfers: Sit to/from Stand Sit to Stand: Mod assist           General transfer comment: Slow to rise, cues for hand placement on platform walker, benefits from cues for upright stand. Time for steadying and hand positioning after rise.    Ambulation/Gait Ambulation/Gait assistance: Mod assist, +2 safety/equipment Gait Distance (Feet): 30 Feet Assistive device: Bilateral platform walker Gait Pattern/deviations: Step-to pattern, Decreased step length - right, Decreased step length - left, Decreased weight shift to right, Decreased weight shift to left, Shuffle, Narrow base of support Gait velocity: reduced Gait velocity interpretation: <1.31 ft/sec, indicative of household ambulator   General Gait Details: Slow pace, some crossing of feet, cues for upright posture, some manual facilitation for fwd progression of bilat platform RW with turning.   Stairs             Wheelchair Mobility     Tilt Bed    Modified Rankin (Stroke Patients Only) Modified Rankin (Stroke Patients Only) Pre-Morbid Rankin Score: No symptoms Modified Rankin: Moderately severe disability     Balance Overall balance assessment: Needs assistance Sitting-balance support: Feet supported, No upper extremity supported Sitting balance-Leahy Scale:  Fair     Standing balance support: Bilateral upper extremity  supported, During functional activity, Reliant on assistive device for balance Standing balance-Leahy Scale: Poor Standing balance comment: reliant on platform RW                            Cognition Arousal/Alertness: Awake/alert Behavior During Therapy: WFL for tasks assessed/performed Overall Cognitive Status: Within Functional Limits for tasks assessed                                          Exercises      General Comments        Pertinent Vitals/Pain Pain Assessment Pain Assessment: No/denies pain Pain Intervention(s): Monitored during session    Home Living                          Prior Function            PT Goals (current goals can now be found in the care plan section) Acute Rehab PT Goals Patient Stated Goal: start moving more PT Goal Formulation: With patient/family Time For Goal Achievement: 05/22/23 Potential to Achieve Goals: Good Progress towards PT goals: Progressing toward goals    Frequency    Min 5X/week      PT Plan Current plan remains appropriate    Co-evaluation              AM-PAC PT "6 Clicks" Mobility   Outcome Measure  Help needed turning from your back to your side while in a flat bed without using bedrails?: A Lot Help needed moving from lying on your back to sitting on the side of a flat bed without using bedrails?: A Lot Help needed moving to and from a bed to a chair (including a wheelchair)?: A Lot Help needed standing up from a chair using your arms (e.g., wheelchair or bedside chair)?: A Lot Help needed to walk in hospital room?: A Lot Help needed climbing 3-5 steps with a railing? : Total 6 Click Score: 11    End of Session Equipment Utilized During Treatment: Gait belt Activity Tolerance: Patient tolerated treatment well Patient left: with call bell/phone within reach;in bed;with bed alarm set Nurse Communication: Mobility status PT Visit Diagnosis: Unsteadiness on  feet (R26.81);Other abnormalities of gait and mobility (R26.89);Muscle weakness (generalized) (M62.81);Difficulty in walking, not elsewhere classified (R26.2);Other symptoms and signs involving the nervous system (R29.898)     Time: 1430-1450 PT Time Calculation (min) (ACUTE ONLY): 20 min  Charges:    $Gait Training: 8-22 mins PT General Charges $$ ACUTE PT VISIT: 1 Visit                     Hendricks Milo, SPT  Acute Rehabilitation Services    Hendricks Milo 05/13/2023, 4:39 PM

## 2023-05-14 ENCOUNTER — Encounter (HOSPITAL_COMMUNITY): Payer: Self-pay | Admitting: Neurosurgery

## 2023-05-14 DIAGNOSIS — I639 Cerebral infarction, unspecified: Secondary | ICD-10-CM | POA: Diagnosis not present

## 2023-05-14 LAB — BASIC METABOLIC PANEL
Anion gap: 6 (ref 5–15)
BUN: 22 mg/dL (ref 8–23)
CO2: 23 mmol/L (ref 22–32)
Calcium: 8.1 mg/dL — ABNORMAL LOW (ref 8.9–10.3)
Chloride: 102 mmol/L (ref 98–111)
Creatinine, Ser: 0.96 mg/dL (ref 0.61–1.24)
GFR, Estimated: >60 mL/min (ref >60)
Glucose, Bld: 117 mg/dL — ABNORMAL HIGH (ref 70–99)
Potassium: 4.4 mmol/L (ref 3.5–5.1)
Sodium: 131 mmol/L — ABNORMAL LOW (ref 135–145)

## 2023-05-14 LAB — MAGNESIUM: Magnesium: 1.8 mg/dL (ref 1.7–2.4)

## 2023-05-14 NOTE — Progress Notes (Signed)
Orthopedic Tech Progress Note Patient Details:  Kenneth [name] 1946/09/01 [id number]  PT called requesting a new soft collar for patient   Ortho Devices Type of Ortho Device: Soft collar Ortho Device/Splint Location: NECK Ortho Device/Splint Interventions: Ordered, Application   Post Interventions Patient Tolerated: Well Instructions Provided: Care of device  Donald Pore 05/14/2023, 6:22 PM

## 2023-05-14 NOTE — Progress Notes (Signed)
PROGRESS NOTE    Kenneth [name]  ZOX:[id number] DOB: 1946/11/05 DOA: 05/07/2023 PCP: Benetta Spar, MD   Brief Narrative:  77 year old male with past medical history of hypertension smoking, former alcohol abuse presenting with worsening facial droop and weakness. 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 Patient underwent ACDF C3-7 with Dr. Franky Macho in the evening of 6/28. He arrives to 4 N. for postoperative monitoring with a drain in place. PCCM is consulted to take over for [facility] as the patient is now in the intensive care unit. Eventually Post Op transferred back to [facility] on 7/1.    Assessment & Plan:  Principal Problem:   Acute ischemic stroke [facility]) Active Problems:   Lower extremity edema   Psoriasis   Edentulous   Former smoker   Former alcohol abuser   Lives alone   Gait instability   Left-sided weakness   Failure to thrive in adult   Facial droop   Mixed hyperlipidemia   CVA (cerebral vascular accident) (HCC)   Stroke (HCC)   Severe cervical disease status post myelopathy - Status post ACDF.  Postop management by surgery team.   Acute left cerebellar stroke. -Previous provider discussed case with neurology.  Recommending aspirin and Plavix for 3 weeks followed by aspirin.  LDL 69.  Echo normal. Will message cardiology for 30 day monitor   Right lower leg ulcer.  Prior history of left leg ulcer. Prior history of left leg ulcer. -ABI and x-ray of the foot unremarkable.  Follow-up outpatient   Essential hypertension. -Norvasc  IV as needed.   HLD. -Daily Lipitor   History of psoriasis. -Continue home meds  COPD. -As needed bronchodilators  PT/OT-SNF  DVT prophylaxis: Place and maintain sequential compression device Start: 05/11/23 1126 SCD's Start: 05/11/23 0052 Code Status: Full Family Communication:   Needs SNF       Diet Orders (From  admission, onward)     Start     Ordered   05/11/23 0957  Diet regular Room service appropriate? Yes; Fluid consistency: Thin  Diet effective now       Question Answer Comment  Room service appropriate? Yes   Fluid consistency: Thin      05/11/23 0957            Subjective: Seen at bedside, no complaints  Examination: Constitutional: Not in acute distress Respiratory: Clear to auscultation bilaterally Cardiovascular: Normal sinus rhythm, no rubs Abdomen: Nontender nondistended good bowel sounds Musculoskeletal: No edema noted Skin: No rashes seen Neurologic: CN 2-12 grossly intact.  And nonfocal Psychiatric: Normal judgment and insight. Alert and oriented x 3. Normal mood. Neck drain in place   Objective: Vitals:   05/14/23 0700 05/14/23 0739 05/14/23 0740 05/14/23 0741  BP: (!) 161/78     Pulse: 63     Resp: 12     Temp:      TempSrc:      SpO2: 98% 98% 98% 97%  Weight:      Height:        Intake/Output Summary (Last 24 hours) at 05/14/2023 0818 Last data filed at 05/14/2023 0600 Gross per 24 hour  Intake 1990.39 ml  Output 2381 ml  Net -390.61 ml   Filed Weights   05/07/23 1124  Weight: 63.5 kg    Scheduled Meds:  amLODipine  5 mg Oral Daily   arformoterol  15 mcg Nebulization BID   aspirin EC  81 mg Oral Daily   atorvastatin  10 mg Oral Daily   B-complex with vitamin C  1 tablet Oral Daily   budesonide (PULMICORT) nebulizer solution  0.5 mg Nebulization BID   Chlorhexidine Gluconate Cloth  6 each Topical Q0600   clopidogrel  75 mg Oral Daily   vitamin B-12  1,000 mcg Oral Daily   famotidine  20 mg Oral Daily   ipratropium-albuterol  3 mL Nebulization BID   mupirocin ointment  1 Application Nasal BID   mouth rinse  15 mL Mouth Rinse 4 times per day   senna  1 tablet Oral BID   Vitamin D (Ergocalciferol)  50,000 Units Oral Q7 days   Continuous Infusions:    Nutritional status     Body mass index is 24.03 kg/m.  Data Reviewed:    CBC: Recent Labs  Lab 05/07/23 1308 05/07/23 1317  WBC 5.9  --   NEUTROABS 3.2  --   HGB 12.9* 14.3  HCT 41.3 42.0  MCV 90.6  --   PLT 289  --    Basic Metabolic Panel: Recent Labs  Lab 05/07/23 1308 05/07/23 1317 05/14/23 0457  NA 137 141 131*  K 4.0 4.2 4.4  CL 104 107 102  CO2 26  --  23  GLUCOSE 113* 111* 117*  BUN 16 15 22   CREATININE 1.07 1.10 0.96  CALCIUM 8.9  --  8.1*  MG  --   --  1.8   GFR: Estimated Creatinine Clearance: 53.1 mL/min (by C-G formula based on SCr of 0.96 mg/dL). Liver Function Tests: Recent Labs  Lab 05/07/23 1308  AST 25  ALT 16  ALKPHOS 58  BILITOT 1.0  PROT 7.3  ALBUMIN 3.6   No results for input(s): "LIPASE", "AMYLASE" in the last 168 hours. No results for input(s): "AMMONIA" in the last 168 hours. Coagulation Profile: Recent Labs  Lab 05/07/23 1308  INR 1.1   Cardiac Enzymes: No results for input(s): "CKTOTAL", "CKMB", "CKMBINDEX", "TROPONINI" in the last 168 hours. BNP (last 3 results) No results for input(s): "PROBNP" in the last 8760 hours. HbA1C: No results for input(s): "HGBA1C" in the last 72 hours. CBG: Recent Labs  Lab 05/07/23 1213  GLUCAP 116*   Lipid Profile: No results for input(s): "CHOL", "HDL", "LDLCALC", "TRIG", "CHOLHDL", "LDLDIRECT" in the last 72 hours. Thyroid Function Tests: No results for input(s): "TSH", "T4TOTAL", "FREET4", "T3FREE", "THYROIDAB" in the last 72 hours. Anemia Panel: No results for input(s): "VITAMINB12", "FOLATE", "FERRITIN", "TIBC", "IRON", "RETICCTPCT" in the last 72 hours. Sepsis Labs: No results for input(s): "PROCALCITON", "LATICACIDVEN" in the last 168 hours.  Recent Results (from the past 240 hour(s))  Surgical pcr screen     Status: Abnormal   Collection Time: 05/10/23  4:08 PM   Specimen: Nasal Mucosa; Nasal Swab  Result Value Ref Range Status   MRSA, PCR POSITIVE (A) NEGATIVE Final    Comment: RESULT CALLED TO, READ BACK BY AND VERIFIED WITH: RN LANCEY  CLARK ON 05/10/23 @ 1730 BY DRT    Staphylococcus aureus POSITIVE (A) NEGATIVE Final    Comment: (NOTE) The Xpert SA Assay (FDA approved for NASAL specimens in patients 46 years of age and older), is one component of a comprehensive surveillance program. It is not intended to diagnose infection nor to guide or monitor treatment. Performed at [facility], [address] [place], [place] [address]          Radiology Studies: No results found.  LOS: 5 days   Time spent= 35 mins    Aison Malveaux Joline Maxcy, MD [facility]  If 7PM-7AM, please contact night-coverage  05/14/2023, 8:18 AM

## 2023-05-14 NOTE — TOC Progression Note (Signed)
Transition of Care [facility]) - Progression Note    Patient Details  Name: Kenneth [name] MRN: [id number] Date of Birth: May 11, 1946  Transition of Care [facility]) CM/SW Contact  Baldemar Len[place], [place] Phone Number: 05/14/2023, 1:30 PM  Clinical Narrative:   [facility] had declined to offer a bed for patient. CSW met with patient to provide other bed offers and discuss options. Patient chose [facility]. CSW confirmed bed availability with [facility] and requested for CMA to start insurance authorization. CSW to follow.    Expected Discharge Plan: Skilled Nursing Facility Barriers to Discharge: Continued Medical Work up, SNF Pending bed offer, English as a second language teacher  Expected Discharge Plan and Services In-house Referral: Clinical Social Work Discharge Planning Services: CM Consult Post Acute Care Choice: Skilled Nursing Facility Living arrangements for the past 2 months: Single Family Home                                       Social Determinants of Health (SDOH) Interventions SDOH Screenings   Food Insecurity: No Food Insecurity (05/07/2023)  Housing: Low Risk  (05/07/2023)  Transportation Needs: No Transportation Needs (05/07/2023)  Utilities: Not At Risk (05/07/2023)  Tobacco Use: Medium Risk (05/14/2023)    Readmission Risk Interventions     No data to display

## 2023-05-14 NOTE — Progress Notes (Signed)
Patient ID: Kenneth [name], male   DOB: 04-13-1946, 77 y.o.   MRN: [id number] BP (!) 94/59 (BP Location: Left Arm)   Pulse 67   Temp 98.4 F (36.9 C) (Oral)   Resp 18   Ht 5\' 4"  (1.626 m)   Wt 63.5 kg   SpO2 100%   BMI 24.03 kg/m  Alert and oriented x 4 Working with PT, was up with assist today Still weak in upper extremities, 4/5. Wound is clean, and dry. Drain removed.

## 2023-05-14 NOTE — Progress Notes (Signed)
Physical Therapy Treatment Patient Details Name: Kenneth [name] MRN: [id number] DOB: 04-16-1946 Today's Date: 05/14/2023   History of Present Illness Pt is a 77 y.o. male who presented to [facility] 05/07/23 with L-sided weakness and facial droop. 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 Pt transferred to [facility] 6/26. PMH: HTN, psoriasis. Pt is s/p ACDF C3-7 6/28.    PT Comments  Pt demonstrates improvement in bed mobility today, transitioning to edge of bed with supervision. Continues to require moderate assist for transfers and limited ambulation using bilateral platform RW. Demonstrates gait abnormalities, poor standing balance, postural impairments, decreased activity tolerance, weakness. Patient will benefit from continued inpatient follow up therapy, <3 hours/day.     Assistance Recommended at Discharge Intermittent Supervision/Assistance  If plan is discharge home, recommend the following:  Can travel by private vehicle    A lot of help with walking and/or transfers;A lot of help with bathing/dressing/bathroom;Assistance with cooking/housework;Assist for transportation;Help with stairs or ramp for entrance   Yes  Equipment Recommendations  Other (comment)    Recommendations for Other Services       Precautions / Restrictions Precautions Precautions: Fall;Cervical;Other (comment) Precaution Booklet Issued: No Precaution Comments: Reviewed with patient verbalizing understanding Required Braces or Orthoses:  Cervical Brace Cervical Brace: Soft collar;For comfort Restrictions Weight Bearing Restrictions: No     Mobility  Bed Mobility Overal bed mobility: Needs Assistance         Sit to supine: Supervision   General bed mobility comments: No physical assist provided today, supervision for safety, HOB elevated    Transfers Overall transfer level: Needs assistance Equipment used: Bilateral platform walker Transfers: Sit to/from Stand Sit to Stand: Mod assist, +2 physical assistance           General transfer comment: Slow to rise, cues for hand placement on platform walker, benefits from cues for upright stand. Time for steadying and hand positioning after rise.    Ambulation/Gait Ambulation/Gait assistance: Mod assist, +2 safety/equipment Gait Distance (Feet): 50 Feet Assistive device: Bilateral platform walker Gait Pattern/deviations: Step-to pattern, Decreased step length - right, Decreased step length - left, Decreased weight shift to right, Decreased weight shift to left, Shuffle, Narrow base of support, Knee hyperextension - right, Knee hyperextension - left Gait velocity: reduced     General Gait Details: Slow pace, some crossing of feet, cues for upright posture and wider BOS, some manual facilitation for fwd progression of bilat platform RW with turning.   Stairs             Wheelchair Mobility     Tilt Bed    Modified Rankin (Stroke Patients Only) Modified Rankin (Stroke Patients Only) Pre-Morbid Rankin Score: No symptoms Modified Rankin: Moderately severe disability     Balance Overall balance assessment: Needs assistance Sitting-balance support: Feet supported, No upper extremity supported Sitting balance-Leahy Scale: Fair     Standing balance support: Bilateral upper extremity supported, During functional activity, Reliant on assistive device for balance Standing balance-Leahy Scale: Poor Standing balance comment: reliant on platform RW  Cognition Arousal/Alertness: Awake/alert Behavior During Therapy: WFL for tasks assessed/performed Overall Cognitive Status: Within Functional Limits for tasks assessed                                          Exercises      General Comments        Pertinent Vitals/Pain Pain Assessment Pain Assessment: Faces Faces Pain Scale: Hurts little more Pain Location: generalized with mobility Pain Descriptors / Indicators: Discomfort, Grimacing Pain Intervention(s): Monitored during session    Home Living                          Prior Function            PT Goals (current goals can now be found in the care plan section) Acute Rehab PT Goals Patient Stated Goal: start moving more PT Goal Formulation: With patient/family Time For Goal Achievement: 05/22/23 Potential to Achieve Goals: Good Progress towards PT goals: Progressing toward goals    Frequency    Min 3X/week      PT Plan Frequency needs to be updated    Co-evaluation              AM-PAC PT "6 Clicks" Mobility   Outcome Measure  Help needed turning from your back to your side while in a flat bed without using bedrails?: A Lot Help needed moving from lying on your back to sitting on the side of a flat bed without using bedrails?: A Lot Help needed moving to and from a bed to a chair (including a wheelchair)?: A Lot Help needed standing up from a chair using your arms (e.g., wheelchair or bedside chair)?: A Lot Help needed to walk in hospital room?: A Lot Help needed climbing 3-5 steps with a railing? : Total 6 Click Score: 11    End of Session Equipment Utilized During Treatment: Gait belt Activity Tolerance: Patient tolerated treatment well Patient left: with call bell/phone within reach;in bed;with bed alarm set Nurse Communication: Mobility status PT Visit Diagnosis: Unsteadiness on feet (R26.81);Other abnormalities of gait and  mobility (R26.89);Muscle weakness (generalized) (M62.81);Difficulty in walking, not elsewhere classified (R26.2);Other symptoms and signs involving the nervous system (R29.898)     Time: 0454-0981 PT Time Calculation (min) (ACUTE ONLY): 33 min  Charges:    $Gait Training: 8-22 mins $Therapeutic Activity: 8-22 mins PT General Charges $$ ACUTE PT VISIT: 1 Visit                     Lillia Pauls, PT, DPT Acute Rehabilitation Services Office [phone]    Kenneth [name] 05/14/2023, 5:19 PM

## 2023-05-14 NOTE — Progress Notes (Signed)
   05/14/23 1618  Spiritual Encounters  Type of Visit Follow up  Care provided to: Pt and family  Referral source Patient request  Reason for visit Advance directives  OnCall Visit No  Spiritual Framework  Presenting Themes Goals in life/care   Completed HCPOA foe Pt

## 2023-05-14 NOTE — Progress Notes (Signed)
   05/14/23 1346  Spiritual Encounters  Type of Visit Initial  Care provided to: Patient  Referral source Patient request  Reason for visit Advance directives  OnCall Visit No  Spiritual Framework  Patient Stress Factors None identified   Pt's brother did not know he was assigned HCPOA. I did education and started the paperwork.

## 2023-05-15 DIAGNOSIS — I639 Cerebral infarction, unspecified: Secondary | ICD-10-CM | POA: Diagnosis not present

## 2023-05-15 LAB — BASIC METABOLIC PANEL
Anion gap: 7 (ref 5–15)
BUN: 24 mg/dL — ABNORMAL HIGH (ref 8–23)
CO2: 24 mmol/L (ref 22–32)
Calcium: 8.2 mg/dL — ABNORMAL LOW (ref 8.9–10.3)
Chloride: 101 mmol/L (ref 98–111)
Creatinine, Ser: 0.88 mg/dL (ref 0.61–1.24)
GFR, Estimated: >60 mL/min (ref >60)
Glucose, Bld: 97 mg/dL (ref 70–99)
Potassium: 4.4 mmol/L (ref 3.5–5.1)
Sodium: 132 mmol/L — ABNORMAL LOW (ref 135–145)

## 2023-05-15 LAB — MAGNESIUM: Magnesium: 1.9 mg/dL (ref 1.7–2.4)

## 2023-05-15 MED ORDER — CLOPIDOGREL BISULFATE 75 MG PO TABS: 75.0000 mg | ORAL_TABLET | Freq: Every day | ORAL | 0 refills | Status: AC

## 2023-05-15 MED ORDER — BISACODYL 5 MG PO TBEC: 5.0000 mg | DELAYED_RELEASE_TABLET | Freq: Every day | ORAL | 0 refills | Status: AC | PRN

## 2023-05-15 MED ORDER — ASPIRIN 81 MG PO TBEC: 81.0000 mg | DELAYED_RELEASE_TABLET | Freq: Every day | ORAL | 12 refills | Status: AC

## 2023-05-15 MED ORDER — FAMOTIDINE 20 MG PO TABS: 20.0000 mg | ORAL_TABLET | Freq: Every day | ORAL | Status: AC

## 2023-05-15 MED ORDER — OXYCODONE HCL 5 MG PO TABS: 5.0000 mg | ORAL_TABLET | Freq: Four times a day (QID) | ORAL | 0 refills | Status: AC | PRN

## 2023-05-15 MED ORDER — VITAMIN D (ERGOCALCIFEROL) 1.25 MG (50000 UNIT) PO CAPS: 50000.0000 [IU] | ORAL_CAPSULE | ORAL | Status: AC

## 2023-05-15 NOTE — Progress Notes (Signed)
Occupational Therapy Treatment Patient Details Name: Kenneth [name] MRN: [id number] DOB: 10/13/1946 Today's Date: 05/15/2023   History of present illness Pt is a 77 y.o. male who presented to [facility] 05/07/23 with L-sided weakness and facial droop. 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 Pt transferred to [facility] 6/26. PMH: HTN, psoriasis. Pt is s/p ACDF C3-7 6/28.   OT comments  Patient continues to demo good progress with OT treatment with min assist to get EOB and mod assist of one to stand to platform walker and transfer to recliner. Patient performed self feeding with min assist with regular utensil and supervision with finger foods. Patient will benefit from continued inpatient follow up therapy, <3 hours/day to address bathing, dressing, eating and grooming. Acute OT to continue to follow.    Recommendations for follow up therapy are one component of a multi-disciplinary discharge planning process, led by the attending physician.  Recommendations may be updated based on patient status, additional functional criteria and insurance authorization.    Assistance Recommended at Discharge Frequent or constant Supervision/Assistance  Patient can return home with the following  A lot of help with walking and/or transfers;A lot of help with bathing/dressing/bathroom;Assistance with cooking/housework;Assistance with feeding;Assist for transportation;Help with stairs or ramp for entrance   Equipment Recommendations  None  recommended by OT    Recommendations for Other Services      Precautions / Restrictions Precautions Precautions: Fall;Cervical;Other (comment) Precaution Booklet Issued: No Required Braces or Orthoses: Cervical Brace Cervical Brace: Soft collar;For comfort Restrictions Weight Bearing Restrictions: No       Mobility Bed Mobility Overal bed mobility: Needs Assistance Bed Mobility: Supine to Sit     Supine to sit: Min assist     General bed mobility comments: min assist to get to EOB    Transfers Overall transfer level: Needs assistance Equipment used: Bilateral platform walker Transfers: Sit to/from Stand Sit to Stand: Mod assist, From elevated surface     Step pivot transfers: Mod assist     General transfer comment: mod assist to stand from raised bed and transfer to recliner.     Balance Overall balance assessment: Needs assistance Sitting-balance support: Feet supported, No upper extremity supported Sitting balance-Leahy Scale: Fair     Standing balance support: Bilateral upper extremity supported, During functional activity, Reliant on assistive device for balance Standing balance-Leahy Scale: Poor Standing balance comment: reliant on platform walker, asked to continue to stand before sitting in reclienr                           ADL either performed or assessed with clinical judgement   ADL Overall ADL's : Needs assistance/impaired Eating/Feeding: Minimal assistance;Sitting Eating/Feeding Details (indicate cue type and reason): with eating ice cream and cookie. Able to eat cookie without assistance Grooming: Wash/dry hands;Wash/dry face;Supervision/safety;Sitting  Extremity/Trunk Assessment              Vision       Perception     Praxis      Cognition Arousal/Alertness: Awake/alert Behavior During Therapy: WFL for tasks assessed/performed Overall Cognitive Status: Within  Functional Limits for tasks assessed                                          Exercises      Shoulder Instructions       General Comments      Pertinent Vitals/ Pain       Pain Assessment Pain Assessment: Faces Faces Pain Scale: Hurts a little bit Pain Location: generalized with mobility Pain Descriptors / Indicators: Discomfort, Grimacing Pain Intervention(s): Monitored during session, Repositioned  Home Living                                          Prior Functioning/Environment              Frequency  Min 2X/week        Progress Toward Goals  OT Goals(current goals can now be found in the care plan section)  Progress towards OT goals: Progressing toward goals  Acute Rehab OT Goals Patient Stated Goal: get better OT Goal Formulation: With patient Time For Goal Achievement: 05/22/23 Potential to Achieve Goals: Good ADL Goals Pt Will Perform Eating: with adaptive utensils;sitting;with set-up Pt Will Perform Grooming: with set-up;sitting Pt Will Perform Upper Body Bathing: with set-up;sitting Pt Will Perform Lower Body Bathing: with min assist;sitting/lateral leans Pt Will Perform Upper Body Dressing: with set-up;sitting Pt Will Perform Lower Body Dressing: with min assist;sitting/lateral leans Pt Will Transfer to Toilet: with min guard assist;ambulating;regular height toilet Pt Will Perform Toileting - Clothing Manipulation and hygiene: sitting/lateral leans;with min guard assist Pt/caregiver will Perform Home Exercise Program: Increased ROM;Increased strength;Both right and left upper extremity;With minimal assist  Plan Discharge plan remains appropriate    Co-evaluation                 AM-PAC OT "6 Clicks" Daily Activity     Outcome Measure   Help from another person eating meals?: A Little Help from another person taking care of personal grooming?: A Little Help from another person toileting, which  includes using toliet, bedpan, or urinal?: A Lot Help from another person bathing (including washing, rinsing, drying)?: A Lot Help from another person to put on and taking off regular upper body clothing?: A Lot Help from another person to put on and taking off regular lower body clothing?: A Lot 6 Click Score: 14    End of Session Equipment Utilized During Treatment: Rolling walker (2 wheels);Other (comment) (Bilateral platform walker)  OT Visit Diagnosis: Unsteadiness on feet (R26.81);Other abnormalities of gait and mobility (R26.89);History of falling (Z91.81);Ataxia, unspecified (R27.0);Other symptoms and signs involving the nervous system (R29.898);Hemiplegia and hemiparesis Hemiplegia - Right/Left: Left Hemiplegia - dominant/non-dominant: Non-Dominant Hemiplegia - caused by: Cerebral infarction   Activity Tolerance Patient tolerated treatment well   Patient Left in chair;with call bell/phone within reach;with chair alarm set   Nurse Communication Mobility status        Time: 5621-3086 OT Time Calculation (min): 26 min  Charges: OT General Charges $OT Visit: 1 Visit OT Treatments $  Self Care/Home Management : 8-22 mins $Therapeutic Activity: 8-22 mins  Alfonse Flavors, OTA Acute Rehabilitation Services  Office [phone]   Dewain Penning 05/15/2023, 2:14 PM

## 2023-05-15 NOTE — Plan of Care (Signed)
Problem: Education: Goal: Knowledge of General Education information will improve Description: Including pain rating scale, medication(s)/side effects and non-pharmacologic comfort measures Outcome: Adequate for Discharge   Problem: Health Behavior/Discharge Planning: Goal: Ability to manage health-related needs will improve Outcome: Adequate for Discharge   Problem: Clinical Measurements: Goal: Ability to maintain clinical measurements within normal limits will improve Outcome: Adequate for Discharge Goal: Will remain free from infection Outcome: Adequate for Discharge Goal: Diagnostic test results will improve Outcome: Adequate for Discharge Goal: Respiratory complications will improve Outcome: Adequate for Discharge Goal: Cardiovascular complication will be avoided Outcome: Adequate for Discharge   Problem: Activity: Goal: Risk for activity intolerance will decrease Outcome: Adequate for Discharge   Problem: Nutrition: Goal: Adequate nutrition will be maintained Outcome: Adequate for Discharge   Problem: Coping: Goal: Level of anxiety will decrease Outcome: Adequate for Discharge   Problem: Elimination: Goal: Will not experience complications related to bowel motility Outcome: Adequate for Discharge Goal: Will not experience complications related to urinary retention Outcome: Adequate for Discharge   Problem: Pain Managment: Goal: General experience of comfort will improve Outcome: Adequate for Discharge   Problem: Safety: Goal: Ability to remain free from injury will improve Outcome: Adequate for Discharge   Problem: Skin Integrity: Goal: Risk for impaired skin integrity will decrease Outcome: Adequate for Discharge   Problem: Education: Goal: Knowledge of disease or condition will improve Outcome: Adequate for Discharge Goal: Knowledge of secondary prevention will improve (MUST DOCUMENT ALL) Outcome: Adequate for Discharge Goal: Knowledge of patient  specific risk factors will improve Loraine Leriche N/A or DELETE if not current risk factor) Outcome: Adequate for Discharge   Problem: Ischemic Stroke/TIA Tissue Perfusion: Goal: Complications of ischemic stroke/TIA will be minimized Outcome: Adequate for Discharge   Problem: Coping: Goal: Will verbalize positive feelings about self Outcome: Adequate for Discharge Goal: Will identify appropriate support needs Outcome: Adequate for Discharge   Problem: Health Behavior/Discharge Planning: Goal: Ability to manage health-related needs will improve Outcome: Adequate for Discharge Goal: Goals will be collaboratively established with patient/family Outcome: Adequate for Discharge   Problem: Self-Care: Goal: Ability to participate in self-care as condition permits will improve Outcome: Adequate for Discharge Goal: Verbalization of feelings and concerns over difficulty with self-care will improve Outcome: Adequate for Discharge Goal: Ability to communicate needs accurately will improve Outcome: Adequate for Discharge   Problem: Nutrition: Goal: Risk of aspiration will decrease Outcome: Adequate for Discharge Goal: Dietary intake will improve Outcome: Adequate for Discharge   Problem: Education: Goal: Ability to verbalize activity precautions or restrictions will improve Outcome: Adequate for Discharge Goal: Knowledge of the prescribed therapeutic regimen will improve Outcome: Adequate for Discharge Goal: Understanding of discharge needs will improve Outcome: Adequate for Discharge   Problem: Activity: Goal: Ability to avoid complications of mobility impairment will improve Outcome: Adequate for Discharge Goal: Ability to tolerate increased activity will improve Outcome: Adequate for Discharge Goal: Will remain free from falls Outcome: Adequate for Discharge   Problem: Bowel/Gastric: Goal: Gastrointestinal status for postoperative course will improve Outcome: Adequate for Discharge    Problem: Clinical Measurements: Goal: Ability to maintain clinical measurements within normal limits will improve Outcome: Adequate for Discharge Goal: Postoperative complications will be avoided or minimized Outcome: Adequate for Discharge Goal: Diagnostic test results will improve Outcome: Adequate for Discharge   Problem: Pain Management: Goal: Pain level will decrease Outcome: Adequate for Discharge   Problem: Skin Integrity: Goal: Will show signs of wound healing Outcome: Adequate for Discharge   Problem: Health Behavior/Discharge Planning: Goal:  Identification of resources available to assist in meeting health care needs will improve Outcome: Adequate for Discharge   Problem: Bladder/Genitourinary: Goal: Urinary functional status for postoperative course will improve Outcome: Adequate for Discharge

## 2023-05-15 NOTE — Discharge Summary (Signed)
Physician Discharge Summary  Kenneth [name] XBJ:[id number] DOB: Apr 05, 1946 DOA: 05/07/2023  PCP: Benetta Spar, MD  Admit date: 05/07/2023 Discharge date: 05/15/2023  Admitted From: Home Disposition: Home  Recommendations for Outpatient Follow-up:  Follow up with PCP in 1-2 weeks Please obtain BMP/CBC in one week your next doctors visit.  Postop neurosurgery to be arranged by their service Aspirin and Plavix for total of 3 weeks followed by aspirin alone Should follow-up outpatient neurology in about 1 month Eventually should get 30-day outpatient monitor once patient is out of rehab.  Currently does not have any reliable address where cardiology office can mail this  Home Health: Going to rehab Equipment/Devices: Going to rehab Discharge Condition: Stable CODE STATUS: Full code Diet recommendation: Low-salt  Brief/Interim Summary: 77 year old male with past medical history of hypertension smoking, former alcohol abuse presenting with worsening facial droop and weakness. 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 Patient underwent ACDF C3-7 with Dr. Franky Macho in the evening of 6/28. He arrives to 4 N. for postoperative monitoring with a drain in place. PCCM is consulted to take over for [facility] as the patient is now in the intensive care unit. Eventually Post Op transferred back to [facility] on 7/1.  Eventually PT recommended CIR but after discussion with the patient he wanted to go to SNF.  Arrangements were made by TOC.  Medically stable for discharge.  Severe cervical disease status post myelopathy - Status post ACDF.  Postop management by surgery team.  Follow-up by their service.  Pain medication with bowel regimen.   Acute left cerebellar stroke. -Previous provider discussed case with neurology.  Recommending aspirin and Plavix for 3 weeks followed by aspirin.  LDL 69.  Echo normal.   Patient does not have reliable address at this time for 30-day monitor to be mailed as recommended by neurology.  Once patient is out of rehab he can follow-up with cardiology/PCP to try and require 30-day monitoring.   Right lower leg ulcer.  Prior history of left leg ulcer. Prior history of left leg ulcer. -ABI and x-ray of the foot unremarkable.  Follow-up outpatient   Essential hypertension. -Norvasc    HLD. -Daily Lipitor   History of psoriasis. -Continue home meds   COPD. -As needed bronchodilators    Assessment and Plan: No notes have been filed under this hospital service. Service: Hospitalist      Body mass index is 24.03 kg/m.       Discharge Diagnoses:  Principal Problem:   Acute ischemic stroke [facility]) Active Problems:   Lower extremity edema   Psoriasis   Edentulous   Former smoker   Former alcohol abuser   Lives alone   Gait instability   Left-sided weakness   Failure to thrive in adult   Facial droop   Mixed hyperlipidemia   CVA (cerebral vascular accident) (HCC)   Stroke [facility])      Consultations: Neurosurgery Neurology  Subjective: Feeling well no complaints.  Overall feels better.  Discharge Exam: Vitals:   05/15/23 0910 05/15/23 0913  BP:    Pulse:    Resp:    Temp:    SpO2: 98% 99%   Vitals:   05/14/23 2346 05/15/23 0338 05/15/23 0910 05/15/23 0913  BP: 111/66 91/61    Pulse: 61 (!) 58    Resp: 16 16    Temp: 97.8 F (36.6 C) 97.8 F (36.6 C)  TempSrc: Oral Oral    SpO2: 100% 99% 98% 99%  Weight:      Height:        General: Pt is alert, awake, not in acute distress Cardiovascular: RRR, S1/S2 +, no rubs, no gallops Respiratory: CTA bilaterally, no wheezing, no rhonchi Abdominal: Soft, NT, ND, bowel sounds + Extremities: no edema, no cyanosis  Discharge Instructions   Allergies as of 05/15/2023   No Known Allergies      Medication List     STOP taking these medications    predniSONE 10 MG  tablet Commonly known as: DELTASONE       TAKE these medications    albuterol 108 (90 Base) MCG/ACT inhaler Commonly known as: VENTOLIN HFA Inhale 2 puffs into the lungs every 6 (six) hours as needed for shortness of breath.   amLODipine 5 MG tablet Commonly known as: NORVASC Take 5 mg by mouth daily. Notes to patient: Your dose today (05/15/2023) was held due to your blood pressure. It is important to check your blood pressure regularly while taking this medication. Always ask a healthcare professional if you are not sure how to take this medication.    aspirin EC 81 MG tablet Take 1 tablet (81 mg total) by mouth daily. Swallow whole.   atorvastatin 10 MG tablet Commonly known as: LIPITOR Take 10 mg by mouth at bedtime.   bisacodyl 5 MG EC tablet Commonly known as: DULCOLAX Take 1 tablet (5 mg total) by mouth daily as needed for moderate constipation.   clopidogrel 75 MG tablet Commonly known as: PLAVIX Take 1 tablet (75 mg total) by mouth daily for 16 days.   famotidine 20 MG tablet Commonly known as: PEPCID Take 1 tablet (20 mg total) by mouth daily.   furosemide 20 MG tablet Commonly known as: LASIX Take 1 tablet (20 mg total) by mouth daily.   mineral oil-hydrophilic petrolatum ointment Apply topically as needed for dry skin. What changed: how much to take   oxyCODONE 5 MG immediate release tablet Commonly known as: Oxy IR/ROXICODONE Take 1-2 tablets (5-10 mg total) by mouth every 6 (six) hours as needed for moderate pain, severe pain or breakthrough pain.   Skyrizi Pen 150 MG/ML pen Generic drug: risankizumab-rzaa Inject 1 mL into the skin once.   Vitamin D (Ergocalciferol) 1.25 MG (50000 UNIT) Caps capsule Commonly known as: DRISDOL Take 1 capsule (50,000 Units total) by mouth every 7 (seven) days. Start taking on: May 16, 2023        Contact information for follow-up providers     Fanta, Wayland Salinas, MD Follow up in 1 week(s).   Specialty:  Internal Medicine Contact information: [address] [place] [address] [phone]              Contact information for after-discharge care     Destination     [facility] Preferred SNF .   Service: Skilled Nursing Contact information: [address] [place] [address] [phone]                    No Known Allergies  You were cared for by a hospitalist during your hospital stay. If you have any questions about your discharge medications or the care you received while you were in the hospital after you are discharged, you can call the unit and asked to speak with the hospitalist on call if the hospitalist that took care of you is not  available. Once you are discharged, your primary care physician will handle any further medical issues. Please note that no refills for any discharge medications will be authorized once you are discharged, as it is imperative that you return to your primary care physician (or establish a relationship with a primary care physician if you do not have one) for your aftercare needs so that they can reassess your need for medications and monitor your lab values.  You were cared for by a hospitalist during your hospital stay. If you have any questions about your discharge medications or the care you received while you were in the hospital after you are discharged, you can call the unit and asked to speak with the hospitalist on call if the hospitalist that took care of you is not available. Once you are discharged, your primary care physician will handle any further medical issues. Please note that NO REFILLS for any discharge medications will be authorized once you are discharged, as it is imperative that you return to your primary care physician (or establish a relationship with a primary care physician if you do not have one) for your aftercare needs so that they can reassess  your need for medications and monitor your lab values.  Please request your Prim.MD to go over all Hospital Tests and Procedure/Radiological results at the follow up, please get all Hospital records sent to your Prim MD by signing hospital release before you go home.  Get CBC, CMP, 2 view Chest X ray checked  by Primary MD during your next visit or SNF MD in 5-7 days ( we routinely change or add medications that can affect your baseline labs and fluid status, therefore we recommend that you get the mentioned basic workup next visit with your PCP, your PCP may decide not to get them or add new tests based on their clinical decision)  On your next visit with your primary care physician please Get Medicines reviewed and adjusted.  If you experience worsening of your admission symptoms, develop shortness of breath, life threatening emergency, suicidal or homicidal thoughts you must seek medical attention immediately by calling 911 or calling your MD immediately  if symptoms less severe.  You Must read complete instructions/literature along with all the possible adverse reactions/side effects for all the Medicines you take and that have been prescribed to you. Take any new Medicines after you have completely understood and accpet all the possible adverse reactions/side effects.   Do not drive, operate heavy machinery, perform activities at heights, swimming or participation in water activities or provide baby sitting services if your were admitted for syncope or siezures until you have seen by Primary MD or a Neurologist and advised to do so again.  Do not drive when taking Pain medications.   Procedures/Studies: DG Cervical Spine 1 View  Result Date: 05/10/2023 CLINICAL DATA:  Cervical spine surgery is possible 6 EXAM: DG CERVICAL SPINE - 1 VIEW COMPARISON:  MRI cervical spine dated 05/08/2023 FINDINGS: Intraoperative lateral cervical radiographs demonstrating interval placement of C3-7 anterior  cervical fusion hardware, in satisfactory position. Additional surgical hardware anterior to the lower cervical spine. IMPRESSION: Intraoperative radiographs during C3-7 ACDF, as above. Electronically Signed   By: Charline Bills M.D.   On: 05/10/2023 22:59   VAS [place] ABI WITH/WO TBI  Result Date: 05/09/2023  LOWER EXTREMITY DOPPLER STUDY Patient Name:  Kenneth [name]  Date of Exam:   05/09/2023 Medical Rec #: [id number]      Accession #:  [id number] Date of Birth: 16-Oct-1946      Patient Gender: M Patient Age:   1 years Exam [facility]  [facility] Procedure:      VAS [place] ABI WITH/WO TBI Referring Phys: PRANAV PATEL --------------------------------------------------------------------------------  Indications: Ulceration. High Risk Factors: Past history of smoking, prior CVA.  Limitations: Today's exam was limited due to bandages. Comparison Study: No previous study. Performing Technologist: McKayla Maag RVT, [place]  Examination Guidelines: A complete evaluation includes at minimum, Doppler waveform signals and systolic blood pressure reading at the level of bilateral brachial, anterior tibial, and posterior tibial arteries, when vessel segments are accessible. Bilateral testing is considered an integral part of a complete examination. Photoelectric Plethysmograph (PPG) waveforms and toe systolic pressure readings are included as required and additional duplex testing as needed. Limited examinations for reoccurring indications may be performed as noted.  ABI Findings: +---------+------------------+-----+-----------+-------------------------------+ Right    Rt Pressure (mmHg)IndexWaveform   Comment                         +---------+------------------+-----+-----------+-------------------------------+ Brachial                        biphasic   No pressure obtained due to IV                                             placement                        +---------+------------------+-----+-----------+-------------------------------+ PTA      134               1.15 multiphasiclimited due to bandage          +---------+------------------+-----+-----------+-------------------------------+ DP       120               1.03 multiphasic                                +---------+------------------+-----+-----------+-------------------------------+ Great Toe93                0.79 Normal                                     +---------+------------------+-----+-----------+-------------------------------+ +---------+------------------+-----+-----------+-------+ Left     Lt Pressure (mmHg)IndexWaveform   Comment +---------+------------------+-----+-----------+-------+ Brachial 117                    triphasic          +---------+------------------+-----+-----------+-------+ PTA      146               1.25 multiphasic        +---------+------------------+-----+-----------+-------+ DP       114               0.97 multiphasic        +---------+------------------+-----+-----------+-------+ Great Toe95                0.81 Normal             +---------+------------------+-----+-----------+-------+ +-------+-----------+-----------+------------+------------+ ABI/TBIToday's ABIToday's TBIPrevious ABIPrevious TBI +-------+-----------+-----------+------------+------------+ Right  1.15       0.79                                +-------+-----------+-----------+------------+------------+  Left   1.25       0.81                                +-------+-----------+-----------+------------+------------+  Summary: Right: Resting right ankle-brachial index is within normal range. The right toe-brachial index is normal. Left: Resting left ankle-brachial index is within normal range. The left toe-brachial index is normal. *See table(s) above for measurements and observations.  Electronically signed by Gerarda Fraction on 05/09/2023 at  7:39:09 PM.    Final    CT ANGIO HEAD W OR WO CONTRAST  Result Date: 05/08/2023 CLINICAL DATA:  Stroke, follow up EXAM: CT ANGIOGRAPHY HEAD AND NECK TECHNIQUE: Multidetector CT imaging of the head and neck was performed using the standard protocol during bolus administration of intravenous contrast. Multiplanar CT image reconstructions and MIPs were obtained to evaluate the vascular anatomy. Carotid stenosis measurements (when applicable) are obtained utilizing NASCET criteria, using the distal internal carotid diameter as the denominator. RADIATION DOSE REDUCTION: This exam was performed according to the departmental dose-optimization program which includes automated exposure control, adjustment of the mA and/or kV according to patient size and/or use of iterative reconstruction technique. CONTRAST:  75mL OMNIPAQUE IOHEXOL 350 MG/ML SOLN COMPARISON:  Same day MRI and CT head. FINDINGS: CTA NECK FINDINGS Aortic arch: Aortic atherosclerosis. Great vessel origins are patent without significant stenosis. Right carotid system: Atherosclerosis at the carotid bifurcation without greater than 50% stenosis. Left carotid system: Atherosclerosis at the carotid bifurcation without greater than 50% stenosis. Vertebral arteries: Severe stenosis of the right vertebral artery origin. Mild left vertebral artery origin stenosis. Patent bilaterally. Skeleton: Severe degenerative change in the cervical spine. No acute abnormality on limited assessment. Other neck: No acute abnormality on limited assessment. Upper chest: Emphysema. Review of the MIP images confirms the above findings CTA HEAD FINDINGS Anterior circulation: Bilateral intracranial ICAs, MCAs, and ACAs are patent without proximal hemodynamically significant stenosis. 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 The anterior communicating artery is not well  visualized. Posterior circulation: Bilateral intradural vertebral arteries, basilar artery and bilateral posterior cerebral arteries are patent without proximal hemodynamically significant stenosis. Small Venous sinuses: As permitted by contrast timing, patent. Review of the MIP images confirms the above findings IMPRESSION: 1. No emergent large vessel occlusion 2. Severe right vertebral artery origin stenosis. 3. 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 4. Aortic Atherosclerosis (ICD10-I70.0) and Emphysema (ICD10-J43.9). Electronically Signed   By: Feliberto Harts M.D.   On: 05/08/2023 15:33   MR CERVICAL SPINE WO CONTRAST  Result Date: 05/08/2023 CLINICAL DATA:  77 year old male with myelopathy. Evidence of cord compression on motion degraded cervical spine MRI yesterday. Repeat attempt with IV medication. EXAM: MRI CERVICAL SPINE WITHOUT CONTRAST TECHNIQUE: Multiplanar, multisequence MR imaging of the cervical spine was performed. No intravenous contrast was administered. COMPARISON:  Brain and cervical spine MRI 05/07/2023. FINDINGS: Despite IV medication and repeated imaging attempts this exam remains moderately degraded by motion. Diffuse cervical spine spondylosis with severe disc space loss and bulky endplate spurring throughout results in spinal stenosis with spinal cord mass effect from C3-C4 through C6-C7. Evidence of associated spinal cord myelomalacia which is likely most pronounced at the C4-C5 level (series 5, image 7). Furthermore, abnormal mild prevertebral fluid or edema is redemonstrated from C2-C3  through C5-C6 (confirmed on axial series 8, image 13). This is nonspecific. No evidence of underlying discitis. Patchy marrow edema in the cervical spine appears to be degenerative in nature. IMPRESSION: 1. Little improved diagnostic image quality despite repeated imaging attempts with IV  medication today. 2. Widespread advanced cervical spine degeneration and spinal stenosis C3-C4 through C6-C7 with spinal cord mass effect at each level. 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 3. Abnormal but nonspecific prevertebral edema or fluid from the C2 to the C6 level. If there has been recent fall/trauma then consider anterior ligamentous injury and in that case recommend noncontrast cervical spine CT which would be most sensitive for cervical vertebral fracture. Electronically Signed   By: Odessa Fleming M.D.   On: 05/08/2023 10:40   MR CERVICAL SPINE WO CONTRAST  Result Date: 05/07/2023 CLINICAL DATA:  Neck pain, ataxia EXAM: MRI CERVICAL SPINE WITHOUT CONTRAST TECHNIQUE: Multiplanar, multisequence MR imaging of the cervical spine was performed. No intravenous contrast was administered. COMPARISON:  None Available. FINDINGS: Evaluation is significantly limited by motion artifact. Limited utility of the sagittal sequences. The axial sequences are nearly nondiagnostic. Alignment: Straightening of the normal cervical lordosis. Vertebrae: No acute fracture, evidence of discitis, or suspicious osseous lesion. Congenitally short pedicles, which narrow the AP diameter of the spinal canal. Cord: Suspect increased T2 signal in the spinal cord at C3-C4 and C4-C5 (series 7, image 8), although this cannot be corroborated on the axial images. Posterior Fossa, vertebral arteries, paraspinal tissues: Trace prevertebral fluid. Disc levels: Motion precludes evaluation of the neural foramina, although there appears to the significant facet and uncovertebral hypertrophy, likely resulting in advanced neural foraminal narrowing at most cervical levels. C2-C3: No significant disc bulge. Mild to moderate spinal canal stenosis. C3-C4: Disc height loss and moderate disc osteophyte complex. Severe spinal canal stenosis. C4-C5: Disc height loss and disc osteophyte complex. Severe spinal  canal stenosis. C5-C6: Disc height loss and disc osteophyte complex. Moderate to severe spinal canal stenosis. C6-C7: Disc height loss and mild disc osteophyte complex. Moderate spinal canal stenosis. C7-T1: Mild disc bulge.  Mild spinal canal stenosis. T1-T2: Disc height loss and mild disc osteophyte complex. Mild spinal canal stenosis. IMPRESSION: 1. Evaluation is significantly limited by motion artifact, with limited utility of the sagittal sequences and nearly nondiagnostic axial sequences. Consider repeating examine the patient is better able to tolerate it. 2. Congenitally short pedicles, which narrow the AP diameter of the spinal canal, with superimposed degenerative disc disease, which causes severe spinal canal stenosis at C3-C4 and C4-C5, moderate to severe spinal canal stenosis at C5-C6, and moderate spinal canal stenosis at C6-C7. 3. Suspect increased T2 signal in the spinal cord at C3-C4 and C4-C5, although this cannot be corroborated on the axial images due to motion. 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 4. Motion precludes evaluation of the neural foramina, although there appears to the significant facet and uncovertebral hypertrophy, likely resulting in advanced neural foraminal narrowing at most cervical levels. Attention on reimaging. Electronically Signed   By: Wiliam Ke M.D.   On: 05/07/2023 17:07   ECHOCARDIOGRAM COMPLETE  Result Date: 05/07/2023    ECHOCARDIOGRAM REPORT   Patient Name:   KAEGAN CHISM Date of Exam: 05/07/2023 Medical Rec #:  [id number]     Height:       64.0 in Accession #:    [id number]    Weight:  140.0 lb Date of Birth:  [birth year]     BSA:          1.681 m Patient Age:    61 years      BP:           110/76 mmHg Patient Gender: M             HR:           57 bpm. [facility] Procedure: 2D Echo, Cardiac Doppler and Color Doppler Indications:    Stroke I63.9  History:        Patient  has no prior history of Echocardiogram examinations.                 Stroke; Risk Factors:Former Smoker. Former alcohol abuser.  Sonographer:    Celesta Gentile RCS Referring Phys: [phone] [facility] IMPRESSIONS  1. Left ventricular ejection fraction, by estimation, is 55 to 60%. The left ventricle has normal function. The left ventricle has no regional wall motion abnormalities. There is mild left ventricular hypertrophy. Left ventricular diastolic parameters are consistent with Grade I diastolic dysfunction (impaired relaxation).  2. Right ventricular systolic function is normal. The right ventricular size is normal. Tricuspid regurgitation signal is inadequate for assessing PA pressure.  3. The mitral valve is normal in structure. Trivial mitral valve regurgitation. No evidence of mitral stenosis.  4. The aortic valve is tricuspid. Aortic valve regurgitation is not visualized. No aortic stenosis is present.  5. Aortic dilatation noted. There is borderline dilatation of the aortic root, measuring 36 mm.  6. The inferior vena cava is normal in size with greater than 50% respiratory variability, suggesting right atrial pressure of 3 mmHg. Comparison(s): No prior Echocardiogram. FINDINGS  Left Ventricle: Left ventricular ejection fraction, by estimation, is 55 to 60%. The left ventricle has normal function. The left ventricle has no regional wall motion abnormalities. The left ventricular internal cavity size was normal in size. There is  mild left ventricular hypertrophy. Left ventricular diastolic parameters are consistent with Grade I diastolic dysfunction (impaired relaxation). Right Ventricle: The right ventricular size is normal. No increase in right ventricular wall thickness. Right ventricular systolic function is normal. Tricuspid regurgitation signal is inadequate for assessing PA pressure. Left Atrium: Left atrial size was normal in size. Right Atrium: Right atrial size was normal in size. Pericardium:  There is no evidence of pericardial effusion. Mitral Valve: The mitral valve is normal in structure. Trivial mitral valve regurgitation. No evidence of mitral valve stenosis. Tricuspid Valve: The tricuspid valve is normal in structure. Tricuspid valve regurgitation is trivial. No evidence of tricuspid stenosis. Aortic Valve: The aortic valve is tricuspid. Aortic valve regurgitation is not visualized. No aortic stenosis is present. Pulmonic Valve: The pulmonic valve was normal in structure. Pulmonic valve regurgitation is trivial. No evidence of pulmonic stenosis. Aorta: Aortic dilatation noted. There is borderline dilatation of the aortic root, measuring 36 mm. Venous: The inferior vena cava is normal in size with greater than 50% respiratory variability, suggesting right atrial pressure of 3 mmHg. IAS/Shunts: No atrial level shunt detected by color flow Doppler.  LEFT VENTRICLE PLAX 2D LVIDd:         4.80 cm   Diastology LVIDs:         3.20 cm   LV e' medial:    6.85 cm/s LV PW:         1.30 cm   LV E/e' medial:  8.6 LV IVS:  1.00 cm   LV e' lateral:   10.60 cm/s LVOT diam:     2.00 cm   LV E/e' lateral: 5.6 LV SV:         57 LV SV Index:   34 LVOT Area:     3.14 cm  RIGHT VENTRICLE RV S prime:     15.40 cm/s TAPSE (M-mode): 2.3 cm LEFT ATRIUM             Index        RIGHT ATRIUM           Index LA diam:        2.90 cm 1.72 cm/m   RA Area:     15.00 cm LA Vol (A2C):   59.0 ml 35.09 ml/m  RA Volume:   38.80 ml  23.08 ml/m LA Vol (A4C):   32.7 ml 19.45 ml/m LA Biplane Vol: 47.3 ml 28.13 ml/m  AORTIC VALVE LVOT Vmax:   84.60 cm/s LVOT Vmean:  53.100 cm/s LVOT VTI:    0.181 m  AORTA Ao Root diam: 3.60 cm MITRAL VALVE MV Area (PHT): 3.42 cm    SHUNTS MV Decel Time: 222 msec    Systemic VTI:  0.18 m MV E velocity: 59.10 cm/s  Systemic Diam: 2.00 cm MV A velocity: 69.20 cm/s MV E/A ratio:  0.85 Vishnu Priya Mallipeddi Electronically signed by Winfield Rast Mallipeddi Signature Date/Time: 05/07/2023/4:41:58 PM     Final    MR BRAIN WO CONTRAST  Result Date: 05/07/2023 CLINICAL DATA:  Neuro deficit, stroke suspected. EXAM: MRI HEAD WITHOUT CONTRAST TECHNIQUE: Multiplanar, multiecho pulse sequences of the brain and surrounding structures were obtained without intravenous contrast. COMPARISON:  Same-day CT head FINDINGS: Brain: There is a small focus of diffusion restriction in the left cerebellar hemisphere consistent with a small acute infarct. There is no hemorrhage or mass effect. There is no other evidence of acute infarct There is no acute intracranial hemorrhage or extra-axial fluid collection. Background parenchymal volume loss with prominence of the ventricular system and extra-axial CSF spaces is unchanged. Encephalomalacia and gliosis in the bilateral frontal lobes is again seen consistent with prior infarcts. There are multiple additional small remote infarcts in the bilateral cerebellar hemispheres. Additional patchy and confluent FLAIR signal abnormality in the supratentorial white matter is consistent with moderate underlying chronic small-vessel ischemic change. The pituitary and suprasellar region are normal. There is no mass lesion. There is no mass effect or midline shift. Vascular: Normal flow voids. Skull and upper cervical spine: There is no suspicious marrow signal abnormality. There is degenerative change of the upper cervical spine with multilevel severe spinal canal stenosis and cord compression, incompletely evaluated. Sinuses/Orbits: The paranasal sinuses are clear. The globes and orbits are unremarkable. Other: There is trace fluid in the left mastoid tip. IMPRESSION: 1. 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 2. Remote infarcts and background chronic small-vessel ischemic change as above. 3. 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 Consider dedicated cervical spine MRI as  indicated. Electronically Signed   By: Lesia Hausen M.D.   On: 05/07/2023 15:26   CT HEAD WO CONTRAST  Result Date: 05/07/2023 CLINICAL DATA:  Provided history: Neuro deficit, acute, stroke suspected. Left-sided weakness. Difficulty ambulating. Altered speech. Vision changes. Dizziness. Headache. EXAM: CT HEAD WITHOUT CONTRAST TECHNIQUE: Contiguous axial images were obtained from the base of the skull through the vertex without intravenous contrast. RADIATION DOSE REDUCTION:  This exam was performed according to the departmental dose-optimization program which includes automated exposure control, adjustment of the mA and/or kV according to patient size and/or use of iterative reconstruction technique. COMPARISON:  Head CT 07/30/2010. FINDINGS: Brain: Redemonstrated foci of chronic encephalomalacia/gliosis within the anteromedial frontal lobes bilaterally, mid right frontal lobe and left frontal operculum/left insula consistent with chronic infarcts. Background patchy and ill-defined hypoattenuation within the cerebral white matter, nonspecific but compatible with moderate chronic small vessel ischemic disease. There is no acute intracranial hemorrhage. No acute demarcated cortical infarct. No extra-axial fluid collection. No evidence of an intracranial mass. No midline shift. Vascular: No hyperdense vessel.  Atherosclerotic calcifications. Skull: No fracture or aggressive osseous lesion. Sinuses/Orbits: No mass or acute finding within the imaged orbits. Trace mucosal thickening within the right maxillary sinus at the imaged levels (with associated chronic reactive osteitis). Small volume secretions within the right sphenoid sinus. IMPRESSION: 1.  No evidence of an acute intracranial abnormality. 2. 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 3. Background moderate chronic small vessel ischemic changes  within the cerebral white matter. 4. Mild paranasal sinus disease at the imaged levels. Electronically Signed   By: Jackey Loge D.O.   On: 05/07/2023 13:01   VAS [place] LOWER EXTREMITY VENOUS REFLUX  Result Date: 04/26/2023  Lower Venous Reflux Study Patient Name:  HOBSON SHOENFELT  Date of Exam:   04/25/2023 Medical Rec #: [id number]      Accession #:    [id number] Date of Birth: 06/09/1946      Patient Gender: M Patient Age:   57 years Exam Location:  [facility] Procedure:      VAS [place] LOWER EXTREMITY VENOUS REFLUX Referring Phys: EMMA COLLINS --------------------------------------------------------------------------------  Indications: Edema. Other Indications: History of cellulitis. Limitations: Movemen. Performing Technologist: Elita Quick RVT  Examination Guidelines: A complete evaluation includes B-mode imaging, spectral Doppler, color Doppler, and power Doppler as needed of all accessible portions of each vessel. Bilateral testing is considered an integral part of a complete examination. Limited examinations for reoccurring indications may be performed as noted. The reflux portion of the exam is performed with the patient in reverse Trendelenburg. Significant venous reflux is defined as >500 ms in the superficial venous system, and >1 second in the deep venous system.  Venous Reflux Times +--------------+---------+------+-----------+------------+--------+ RIGHT         Reflux NoRefluxReflux TimeDiameter cmsComments                         Yes                                  +--------------+---------+------+-----------+------------+--------+ CFV                     yes   >1 second                      +--------------+---------+------+-----------+------------+--------+ FV mid                  yes   >1 second                      +--------------+---------+------+-----------+------------+--------+ Popliteal     no                                              +--------------+---------+------+-----------+------------+--------+  GSV at [facility]    no                            0.83             +--------------+---------+------+-----------+------------+--------+ GSV prox thighno                            0.73             +--------------+---------+------+-----------+------------+--------+ GSV mid thigh no                            0.56             +--------------+---------+------+-----------+------------+--------+ GSV dist thighno                            0.57             +--------------+---------+------+-----------+------------+--------+ GSV at knee   no                            0.58             +--------------+---------+------+-----------+------------+--------+ GSV prox calf           yes    >500 ms      0.50             +--------------+---------+------+-----------+------------+--------+ GSV mid calf  no                            0.31             +--------------+---------+------+-----------+------------+--------+ GSV dist calf no                            0.26             +--------------+---------+------+-----------+------------+--------+ SSV Pop Fossa no                            0.31             +--------------+---------+------+-----------+------------+--------+ SSV prox calf no                            0.33             +--------------+---------+------+-----------+------------+--------+ SSV mid calf  no                            0.26             +--------------+---------+------+-----------+------------+--------+ AASV O        no                            0.22             +--------------+---------+------+-----------+------------+--------+ AASV P        no                            0.17             +--------------+---------+------+-----------+------------+--------+ AASV M  NV        +--------------+---------+------+-----------+------------+--------+   Summary: Right: - No evidence of deep vein thrombosis seen in the right lower extremity, from the common femoral through the popliteal veins. - No evidence of superficial venous thrombosis in the right lower extremity. - No evidence of superficial venous reflux seen in the right short saphenous vein. - Venous reflux is noted in the right common femoral vein. - Venous reflux is noted in the right greater saphenous vein in the calf. - Venous reflux is noted in the right femoral vein.  *See table(s) above for measurements and observations. Electronically signed by Gerarda Fraction on 04/26/2023 at 5:57:25 PM.    Final      The results of significant diagnostics from this hospitalization (including imaging, microbiology, ancillary and laboratory) are listed below for reference.     Microbiology: Recent Results (from the past 240 hour(s))  Surgical pcr screen     Status: Abnormal   Collection Time: 05/10/23  4:08 PM   Specimen: Nasal Mucosa; Nasal Swab  Result Value Ref Range Status   MRSA, PCR POSITIVE (A) NEGATIVE Final    Comment: RESULT CALLED TO, READ BACK BY AND VERIFIED WITH: RN LANCEY CLARK ON 05/10/23 @ 1730 BY DRT    Staphylococcus aureus POSITIVE (A) NEGATIVE Final    Comment: (NOTE) The Xpert SA Assay (FDA approved for NASAL specimens in patients 104 years of age and older), is one component of a comprehensive surveillance program. It is not intended to diagnose infection nor to guide or monitor treatment. Performed at [facility], [address], [place], [place] [address]      Labs: BNP (last 3 results) Recent Labs    11/28/22 1302 12/13/22 1310  BNP 83.0 49.0   Basic Metabolic Panel: Recent Labs  Lab 05/14/23 0457 05/15/23 0455  NA 131* 132*  K 4.4 4.4  CL 102 101  CO2 23 24  GLUCOSE 117* 97  BUN 22 24*  CREATININE 0.96 0.88  CALCIUM 8.1* 8.2*  MG 1.8 1.9   Liver Function Tests: No  results for input(s): "AST", "ALT", "ALKPHOS", "BILITOT", "PROT", "ALBUMIN" in the last 168 hours. No results for input(s): "LIPASE", "AMYLASE" in the last 168 hours. No results for input(s): "AMMONIA" in the last 168 hours. CBC: No results for input(s): "WBC", "NEUTROABS", "HGB", "HCT", "MCV", "PLT" in the last 168 hours. Cardiac Enzymes: No results for input(s): "CKTOTAL", "CKMB", "CKMBINDEX", "TROPONINI" in the last 168 hours. BNP: Invalid input(s): "POCBNP" CBG: No results for input(s): "GLUCAP" in the last 168 hours. D-Dimer No results for input(s): "DDIMER" in the last 72 hours. Hgb A1c No results for input(s): "HGBA1C" in the last 72 hours. Lipid Profile No results for input(s): "CHOL", "HDL", "LDLCALC", "TRIG", "CHOLHDL", "LDLDIRECT" in the last 72 hours. Thyroid function studies No results for input(s): "TSH", "T4TOTAL", "T3FREE", "THYROIDAB" in the last 72 hours.  Invalid input(s): "FREET3" Anemia work up No results for input(s): "VITAMINB12", "FOLATE", "FERRITIN", "TIBC", "IRON", "RETICCTPCT" in the last 72 hours. Urinalysis No results found for: "COLORURINE", "APPEARANCEUR", "LABSPEC", "PHURINE", "GLUCOSEU", "HGBUR", "BILIRUBINUR", "KETONESUR", "PROTEINUR", "UROBILINOGEN", "NITRITE", "LEUKOCYTESUR" Sepsis Labs No results for input(s): "WBC" in the last 168 hours.  Invalid input(s): "PROCALCITONIN", "LACTICIDVEN" Microbiology Recent Results (from the past 240 hour(s))  Surgical pcr screen     Status: Abnormal   Collection Time: 05/10/23  4:08 PM   Specimen: Nasal Mucosa; Nasal Swab  Result Value Ref Range Status   MRSA, PCR POSITIVE (A) NEGATIVE Final  Comment: RESULT CALLED TO, READ BACK BY AND VERIFIED WITH: RN LANCEY CLARK ON 05/10/23 @ 1730 BY DRT    Staphylococcus aureus POSITIVE (A) NEGATIVE Final    Comment: (NOTE) The Xpert SA Assay (FDA approved for NASAL specimens in patients 49 years of age and older), is one component of a comprehensive surveillance  program. It is not intended to diagnose infection nor to guide or monitor treatment. Performed at [facility], [address], [place], [place] [address]      Time coordinating discharge:  I have spent 35 minutes face to face with the patient and on the ward discussing the patients care, assessment, plan and disposition with other care givers. >50% of the time was devoted counseling the patient about the risks and benefits of treatment/Discharge disposition and coordinating care.   SIGNED:   Dimple Nanas, MD  [facility] 05/15/2023, 12:06 PM   If 7PM-7AM, please contact night-coverage

## 2023-05-15 NOTE — Care Management Important Message (Signed)
Important Message  Patient Details  Name: Kenneth [name] MRN: [id number] Date of Birth: 1946/03/06   Medicare Important Message Given:  Yes     [name] Stefan Church 05/15/2023, 1:53 PM

## 2023-05-15 NOTE — TOC Transition Note (Signed)
Transition of Care [facility]) - CM/SW Discharge Note   Patient Details  Name: Kenneth [name] MRN: [id number] Date of Birth: Oct 18, 1946  Transition of Care [facility] CM/SW Contact:  Baldemar Lenis, LCSW Phone Number: 05/15/2023, 12:38 PM   Clinical Narrative:   CSW received insurance authorization for patient to admit to SNF, and confirmed bed will be ready today. CSW updated MD, patient is stable for discharge. CSW sent discharge to [facility], bed will be available after 1:30. CSW spoke with patient and he is in agreement. Transport arranged with [facility] for after 1:30 PM.  Nurse to call report to [phone], Room A7 Bed 1.     Final next level of care: Skilled Nursing Facility Barriers to Discharge: Barriers Resolved   Patient Goals and CMS Choice CMS Medicare.gov Compare Post Acute Care list provided to:: Patient Choice offered to / list presented to : Patient  Discharge Placement                Patient chooses bed at:  [facility]) Patient to be transferred to facility by: [facility] Name of family member notified: Self Patient and family notified of of transfer: 05/15/23  Discharge Plan and Services Additional resources added to the After Visit Summary for   In-house Referral: Clinical Social Work Discharge Planning Services: CM Consult Post Acute Care Choice: Skilled Nursing Facility                               Social Determinants of Health (SDOH) Interventions SDOH Screenings   Food Insecurity: No Food Insecurity (05/07/2023)  Housing: Low Risk  (05/07/2023)  Transportation Needs: No Transportation Needs (05/07/2023)  Utilities: Not At Risk (05/07/2023)  Tobacco Use: Medium Risk (05/14/2023)     Readmission Risk Interventions     No data to display

## 2023-06-04 ENCOUNTER — Encounter: Payer: Self-pay | Admitting: Physician Assistant

## 2023-06-04 ENCOUNTER — Ambulatory Visit (INDEPENDENT_AMBULATORY_CARE_PROVIDER_SITE_OTHER): Payer: 59 | Admitting: Physician Assistant

## 2023-06-04 VITALS — BP 109/69 | HR 69 | Temp 98.1°F | Resp 18 | Ht 63.0 in | Wt 137.0 lb

## 2023-06-04 DIAGNOSIS — L97511 Non-pressure chronic ulcer of other part of right foot limited to breakdown of skin: Secondary | ICD-10-CM

## 2023-06-04 DIAGNOSIS — M7989 Other specified soft tissue disorders: Secondary | ICD-10-CM

## 2023-06-04 NOTE — Progress Notes (Signed)
[facility]ASC[facility]           [facility] [place]  History and Physical   Kenneth [name] is a 77 y.o. male who presents with wound on the right foot.    He originally had some swelling in the right leg and was sent for venous duplex.  He states that since then, his leg swelling has gotten better.    He states that he had a stroke last month.  He  underwent  Anterior Cervical decompression C3-7, Arthrodesis C3/4,4/5,5/6,6/7 with 8/7/7/and 6mm structural allografts, Anterior instrumentation(nuvasive) C3/4,4/5,5/6,6/7 on 05/10/2023 by Dr. Franky Macho.  Per neurology consult note, he initially stated he had acute weakness in the left upper and lower extremity. Stroke workup only revealed small cerebellar infarct. However the brain MRI strongly suggested cervical stenosis. I was called this morning and recommended a repeat study since the first study was poor due to movement. The repeat study was actually worse.  It was also noted that his history left out pertinent details. His weakness could be dated back at least two weeks. His brother recalls having to pick Kenneth [name] up and carrying him to his car to take him to an appointment. 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   He is currently in SNF recovering.    He comes in today with an ulcer on the dorsum of the right foot that he and his brother state have been present for ~ 4-6 weeks.  His brother tells me this is improving and that it was more fleshy and is now drying up.  Pt states prior to his hospitalization, he was walking without pain in his legs.  He currently does not have arterial rest pain.  He recently quit smoking after smoking for many years.     The pt is on a statin for cholesterol management.  The  pt is on a daily aspirin.   Other AC:  Plavix The pt is on CCB, diuretic for hypertension.   The pt is not on medication for diabetes.   Tobacco hx:  recently quit  Pt does not have family hx of AAA.  Past Medical History:  Diagnosis Date   Hypertension    Psoriasis     Past Surgical History:  Procedure Laterality Date   ANTERIOR CERVICAL DECOMPRESSION/DISCECTOMY FUSION 4 LEVELS N/A 05/10/2023   Procedure: Cervical three to Cervical seven Anterior Cervical Decompression Fusion;  Surgeon: Coletta Memos, MD;  Location: [facility] OR;  Service: Neurosurgery;  Laterality: N/A;   HERNIA REPAIR      Social History   Socioeconomic History   Marital status: Divorced    Spouse name: Not on file   Number of children: Not on file   Years of education: Not on file   Highest education level: Not on file  Occupational History   Not on file  Tobacco Use   Smoking status: Former    Types: Cigarettes   Smokeless tobacco: Never  Vaping Use   Vaping status: Never Used  Substance and Sexual Activity   Alcohol use: Yes    Comment: a couple times a week   Drug use: Not Currently   Sexual activity: Not on file  Other Topics Concern   Not on file  Social History Narrative   Not on  file   Social Determinants of Health   Financial Resource Strain: Not on file  Food Insecurity: No Food Insecurity (05/07/2023)   Hunger Vital Sign    Worried About Running Out of Food in the Last Year: Never true    Ran Out of Food in the Last Year: Never true  Transportation Needs: No Transportation Needs (05/07/2023)   PRAPARE - Administrator, Civil Service (Medical): No    Lack of Transportation (Non-Medical): No  Physical Activity: Not on file  Stress: Not on file  Social Connections: Not on file  Intimate Partner Violence: Not At Risk (05/07/2023)   Humiliation, Afraid, Rape, and Kick questionnaire    Fear of Current or Ex-Partner: No    Emotionally Abused: No    Physically Abused: No     Sexually Abused: No    Family hx is negative for AAA.   Current Outpatient Medications  Medication Sig Dispense Refill   albuterol (VENTOLIN HFA) 108 (90 Base) MCG/ACT inhaler Inhale 2 puffs into the lungs every 6 (six) hours as needed for shortness of breath.     amLODipine (NORVASC) 5 MG tablet Take 5 mg by mouth daily.     aspirin EC 81 MG tablet Take 1 tablet (81 mg total) by mouth daily. Swallow whole. 30 tablet 12   atorvastatin (LIPITOR) 10 MG tablet Take 10 mg by mouth at bedtime.     bisacodyl (DULCOLAX) 5 MG EC tablet Take 1 tablet (5 mg total) by mouth daily as needed for moderate constipation. 30 tablet 0   famotidine (PEPCID) 20 MG tablet Take 1 tablet (20 mg total) by mouth daily.     furosemide (LASIX) 20 MG tablet Take 1 tablet (20 mg total) by mouth daily. 10 tablet 0   mineral oil-hydrophilic petrolatum (AQUAPHOR) ointment Apply topically as needed for dry skin. (Patient taking differently: Apply 1 Application topically as needed for dry skin.) 420 g 0   oxyCODONE (OXY IR/ROXICODONE) 5 MG immediate release tablet Take 1-2 tablets (5-10 mg total) by mouth every 6 (six) hours as needed for moderate pain, severe pain or breakthrough pain. 30 tablet 0   SKYRIZI PEN 150 MG/ML pen Inject 1 mL into the skin once.     Vitamin D, Ergocalciferol, (DRISDOL) 1.25 MG (50000 UNIT) CAPS capsule Take 1 capsule (50,000 Units total) by mouth every 7 (seven) days. 5 capsule    No current facility-administered medications for this visit.    No Known Allergies  REVIEW OF SYSTEMS:   [X]  denotes positive finding, [ ]  denotes negative finding Cardiac  Comments:  Chest pain or chest pressure:    Shortness of breath upon exertion:    Short of breath when lying flat:    Irregular heart rhythm:        Vascular    Pain in calf, thigh, or hip brought on by ambulation:    Pain in feet at night that wakes you up from your sleep:     Blood clot in your veins:    Leg swelling:  x improved       Pulmonary    Oxygen at home:    Productive cough:     Wheezing:         Neurologic    Sudden weakness in arms or legs:     Sudden numbness in arms or legs:     Sudden onset of difficulty speaking or slurred speech:    Temporary loss of vision in one eye:  Problems with dizziness:         Gastrointestinal    Blood in stool:     Vomited blood:         Genitourinary    Burning when urinating:     Blood in urine:        Psychiatric    Major depression:         Hematologic    Bleeding problems:    Problems with blood clotting too easily:        Skin    Rashes or ulcers: x Right foot      Constitutional    Fever or chills:      PHYSICAL EXAMINATION:  Today's Vitals   06/04/23 1044 06/04/23 1049  BP: 109/69   Pulse: 69   Resp: 18   Temp: 98.1 F (36.7 C)   TempSrc: Temporal   SpO2: 96%   Weight: 137 lb (62.1 kg)   Height: 5\' 3"  (1.6 m)   PainSc: 10-Worst pain ever 10-Worst pain ever  PainLoc: Leg    Body mass index is 24.27 kg/m.   General:  WDWN in NAD; vital signs documented above Gait: Not observed HENT: WNL, normocephalic; neck brace in place Pulmonary: normal non-labored breathing without wheezing Cardiac: regular HR Abdomen: soft, NT, aortic pulse is not palpable Skin: without rashes Vascular Exam/Pulses:  Right Left  Radial 2+ (normal) 2+ (normal)  DP Brisk multiphasic Brisk multiphasic  PT Brisk multiphasic  Brisk multiphasic   Extremities:       Neurologic: A&O X 3;  moving all extremities equally Psychiatric:  The pt has Normal affect.   Non-Invasive Vascular Imaging:   Venous duplex on 04/25/2023: Venous Reflux Times  +--------------+---------+------+-----------+------------+--------+  RIGHT        Reflux NoRefluxReflux TimeDiameter cmsComments                          Yes                                   +--------------+---------+------+-----------+------------+--------+  CFV                    yes   >1  second                       +--------------+---------+------+-----------+------------+--------+  FV mid                  yes   >1 second                       +--------------+---------+------+-----------+------------+--------+  Popliteal    no                                              +--------------+---------+------+-----------+------------+--------+  GSV at [facility]    no                            0.83              +--------------+---------+------+-----------+------------+--------+  GSV prox thighno                            0.73              +--------------+---------+------+-----------+------------+--------+  GSV mid thigh no                            0.56              +--------------+---------+------+-----------+------------+--------+  GSV dist thighno                            0.57              +--------------+---------+------+-----------+------------+--------+  GSV at knee   no                            0.58              +--------------+---------+------+-----------+------------+--------+  GSV prox calf           yes    >500 ms      0.50              +--------------+---------+------+-----------+------------+--------+  GSV mid calf  no                            0.31              +--------------+---------+------+-----------+------------+--------+  GSV dist calf no                            0.26              +--------------+---------+------+-----------+------------+--------+  SSV Pop Fossa no                            0.31              +--------------+---------+------+-----------+------------+--------+  SSV prox calf no                            0.33              +--------------+---------+------+-----------+------------+--------+  SSV mid calf  no                            0.26              +--------------+---------+------+-----------+------------+--------+  AASV O        no                             0.22              +--------------+---------+------+-----------+------------+--------+  AASV P        no                            0.17              +--------------+---------+------+-----------+------------+--------+  AASV M                                              NV        +--------------+---------+------+-----------+------------+--------+   Summary:  Right:  - No evidence of deep vein thrombosis seen in the right lower  extremity, from the common femoral through the popliteal veins.  - No evidence of superficial venous thrombosis in the right lower extremity.  - No evidence of superficial venous reflux seen in the right short saphenous vein.  - Venous reflux is noted in the right common femoral vein.  - Venous reflux is noted in the right greater saphenous vein in the calf.  - Venous reflux is noted in the right femoral vein     Kenneth [name] is a 77 y.o. male who presents with: recent leg swelling and ulceration on the dorsum of the right foot    Leg swelling -pt does not have evidence of DVT.  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  He is not a candidate for laser ablation as he does not have reflux at the [facility] or throughout the GSV and the vein is not of sufficient size.  His leg swelling has improved.   Right foot ulceration -pt with foot ulceration on the right as pictured above.  He has multiphasic waveforms and normal toe pressures and good doppler flow on my exam.  This ulceration has been present for ~ 4-6 weeks.  His brother tells me it is improving.  Given this information, will have him return in 3-4 weeks to check the wound for improvement.  If it is not improving, he may need angiogram to evaluate blood flow but hopeful given he has normal toe pressures that he can heal this.  He and his brother know to call sooner if any issues before then.  Fortunately, he quit smoking recently.    Pt  may would benefit from 15-67mmHg knee high compression but would hold on this until this wound has healed.    -continue asa/statin/plavix   Doreatha Massed, [facility] [phone]  Clinic MD:  Chestine Spore

## 2023-06-26 ENCOUNTER — Ambulatory Visit (INDEPENDENT_AMBULATORY_CARE_PROVIDER_SITE_OTHER): Payer: 59 | Admitting: Vascular Surgery

## 2023-06-26 ENCOUNTER — Encounter: Payer: Self-pay | Admitting: Vascular Surgery

## 2023-06-26 VITALS — BP 138/79 | HR 58 | Temp 97.7°F | Resp 18 | Ht 65.0 in | Wt 137.0 lb

## 2023-06-26 DIAGNOSIS — L97511 Non-pressure chronic ulcer of other part of right foot limited to breakdown of skin: Secondary | ICD-10-CM | POA: Diagnosis not present

## 2023-06-26 NOTE — Progress Notes (Signed)
Patient ID: Kenneth [name], male   DOB: [birth year], 77 y.o.   MRN: [id number]  Reason for Consult: Follow-up (3-4 week wound check evaluate for surgery )   Referred by Avon Gully Demissie*  Subjective:     HPI:  Kenneth [name] is a 77 y.o. male initially evaluated in our office a few months ago for right foot wound.  Patient with history of stroke and weakness of his left side and currently recovering in SNF.  Patient previously walked without any limitation.  He has undergone arterial and venous studies without any abnormalities noted.  He does take statin, aspirin and Plavix.  He is not on any blood thinners.  Past Medical History:  Diagnosis Date   Hypertension    Psoriasis    No family history on file. Past Surgical History:  Procedure Laterality Date   ANTERIOR CERVICAL DECOMPRESSION/DISCECTOMY FUSION 4 LEVELS N/A 05/10/2023   Procedure: Cervical three to Cervical seven Anterior Cervical Decompression Fusion;  Surgeon: Coletta Memos, MD;  Location: [facility] OR;  Service: Neurosurgery;  Laterality: N/A;   HERNIA REPAIR      Short Social History:  Social History   Tobacco Use   Smoking status: Former    Types: Cigarettes   Smokeless tobacco: Never  Substance Use Topics   Alcohol use: Yes    Comment: a couple times a week    No Known Allergies  Current Outpatient Medications  Medication Sig Dispense Refill   albuterol (VENTOLIN HFA) 108 (90 Base) MCG/ACT inhaler Inhale 2 puffs into the lungs every 6 (six) hours as needed for shortness of breath.     amLODipine (NORVASC) 5 MG tablet Take 5 mg by mouth daily.     aspirin EC 81 MG tablet Take 1 tablet (81 mg total) by mouth daily. Swallow whole. 30 tablet 12   atorvastatin (LIPITOR) 10 MG tablet Take 10 mg by mouth at bedtime.     bisacodyl (DULCOLAX) 5 MG EC tablet Take 1 tablet (5 mg total) by mouth daily as needed for moderate constipation. 30 tablet 0   famotidine (PEPCID) 20 MG tablet Take 1 tablet (20 mg total) by  mouth daily.     furosemide (LASIX) 20 MG tablet Take 1 tablet (20 mg total) by mouth daily. 10 tablet 0   mineral oil-hydrophilic petrolatum (AQUAPHOR) ointment Apply topically as needed for dry skin. (Patient taking differently: Apply 1 Application topically as needed for dry skin.) 420 g 0   oxyCODONE (OXY IR/ROXICODONE) 5 MG immediate release tablet Take 1-2 tablets (5-10 mg total) by mouth every 6 (six) hours as needed for moderate pain, severe pain or breakthrough pain. 30 tablet 0   SKYRIZI PEN 150 MG/ML pen Inject 1 mL into the skin once.     No current facility-administered medications for this visit.    Review of Systems  Constitutional:  Constitutional negative. HENT: HENT negative.  Eyes: Eyes negative.  Cardiovascular: Positive for leg swelling.  GI: Gastrointestinal negative.  Musculoskeletal: Musculoskeletal negative.  Skin: Positive for wound.  Neurological: Positive for focal weakness.  Hematologic: Hematologic/lymphatic negative.  Psychiatric: Psychiatric negative.        Objective:  Objective   Vitals:   06/26/23 0853  BP: 138/79  Pulse: (!) 58  Resp: 18  Temp: 97.7 F (36.5 C)  TempSrc: Temporal  SpO2: 97%  Weight: 137 lb (62.1 kg)  Height: 5\' 5"  (1.651 m)   Body mass index is 22.8 kg/m.  Physical Exam HENT:     Head:  Normocephalic.     Mouth/Throat:     Mouth: Mucous membranes are moist.  Cardiovascular:     Rate and Rhythm: Normal rate.     Pulses: Normal pulses.  Abdominal:     General: Abdomen is flat.     Palpations: Abdomen is soft.  Musculoskeletal:     Right lower leg: No edema.     Left lower leg: No edema.  Skin:    Capillary Refill: Capillary refill takes less than 2 seconds.  Neurological:     General: No focal deficit present.     Mental Status: He is alert.  Psychiatric:        Mood and Affect: Mood normal.        Thought Content: Thought content normal.        Judgment: Judgment normal.     Data: Arterial and  venous studies reviewed.     Assessment/Plan:    77 year old male with wound on the right foot as noted above with normal arterial and venous studies.  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  Thankfully his wound appears to be healing and the pictures placed above.  Continue Medihoney at the SNF and he will follow-up in 2 years with AAA duplex as he has recently had CT scan which demonstrates the ectasia that was previously demonstrated on ultrasound in 2019 but appears to be stable in size.     Maeola Harman MD [facility]

## 2023-08-02 ENCOUNTER — Other Ambulatory Visit (INDEPENDENT_AMBULATORY_CARE_PROVIDER_SITE_OTHER): Payer: 59

## 2023-08-02 ENCOUNTER — Ambulatory Visit (INDEPENDENT_AMBULATORY_CARE_PROVIDER_SITE_OTHER): Payer: 59 | Admitting: Orthopedic Surgery

## 2023-08-02 DIAGNOSIS — M25531 Pain in right wrist: Secondary | ICD-10-CM | POA: Diagnosis not present

## 2023-08-02 NOTE — Progress Notes (Signed)
Kenneth [name] - 77 y.o. male MRN [id number]  Date of birth: 1946/05/21  Office Visit Note: Visit Date: 08/02/2023 PCP: Benetta Spar, MD Referred by: Benetta Spar*  Subjective: No chief complaint on file.  HPI: Kenneth [name] is a pleasant 77 y.o. male who presents today for evaluation of ongoing right wrist pain and swelling.  There is history of a potential remote injury from this summer, however patient is a poor historian and is unable to give any further detail.  His brother is with him in the room who estimates this may have occurred in June of this year.  He did undergo recent cervical surgery as well which has been undergoing rehab.  Currently having ongoing swelling at the right hand and wrist with associated stiffness.  Has not been immobilized or given any formalized treatments for the right wrist to date.  Pertinent ROS were reviewed with the patient and found to be negative unless otherwise specified above in HPI.   Visit Reason: right hand/wrist Duration of symptoms: Unclear Hand dominance: right Occupation: retired Diabetic: No Heart/Lung History: none Blood Thinners:  aspirin   Assessment & Plan: Visit Diagnoses:  1. Pain in right wrist     Plan: Extensive discussion was had the patient and his brother today regarding the right wrist.  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  I have recommended that he utilize a right wrist brace which we will have him fitted for today and remain nonweightbearing for the following 2 weeks and to allow for soft tissue rest and help improve his swelling.  In 2 weeks, he can begin range of motion with therapy and progress to strengthening as tolerated.  At that  juncture, I am comfortable with him beginning partial weightbearing and progressing as tolerated as well.  He is welcome to return to me in the future for further discussion, I did emphasize that the state of his wrist at this juncture is chronic in nature from a radiocarpal arthritis standpoint, once he is recovered from the cervical spine operation, we could consider salvage operations for the right wrist should he remain symptomatic in the future.  Follow-up: No follow-ups on file.   Meds & Orders: No orders of the defined types were placed in this encounter.   Orders Placed This Encounter  Procedures   XR Wrist Complete Right     Procedures: No procedures performed      Clinical History: No specialty comments available.  He reports that he has quit smoking. His smoking use included cigarettes. He has never used smokeless tobacco.  Recent Labs    05/07/23 1501  HGBA1C 5.8*    Objective:   Vital Signs: There were no vitals taken for this visit.  Physical Exam  Gen: Well-appearing, in no acute distress; non-toxic CV: Regular Rate. Well-perfused. Warm.  Resp: Breathing unlabored on room air; no wheezing. Psych: Fluid speech in conversation; appropriate affect; normal thought process  Ortho Exam Right wrist: - Notable swelling, over the dorsal aspect of the right wrist, mild tenderness with deep palpation at the radiocarpal interval, tenderness associated at the anatomic snuffbox with deep palpation - Wrist range of motion flexion/extension 0/45, improved passively, radial/ulnar deviation is preserved, pronation/supination is preserved without significant pain -  Sensation intact distally, hand is warm well-perfused  Imaging: XR Wrist Complete Right  Result Date: 08/02/2023 X-rays of the right wrist, 2 views were obtained today There is significant degenerative change seen at the radiocarpal and midcarpal articulations.  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  There is what appears to be chronic fracture line at the midportion of the scaphoid as well with irregularity of the radial scaphoid interval.  Ulnar neutral variance.   Past Medical/Family/Surgical/Social History: Medications & Allergies reviewed per EMR, new medications updated. 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 Past Surgical History:  Procedure Laterality Date   ANTERIOR CERVICAL DECOMPRESSION/DISCECTOMY FUSION 4 LEVELS N/A 05/10/2023   Procedure: Cervical three to Cervical seven Anterior Cervical Decompression Fusion;  Surgeon: Coletta Memos, MD;  Location: [facility] OR;  Service: Neurosurgery;  Laterality: N/A;   HERNIA REPAIR     Social History   Occupational History   Not on file  Tobacco Use   Smoking status: Former    Types: Cigarettes   Smokeless tobacco: Never  Vaping Use   Vaping status: Never Used  Substance and Sexual Activity   Alcohol use: Yes    Comment: a couple times a week   Drug use: Not Currently   Sexual activity: Not on file    Manessa Buley Fara Boros) Denese Killings, M.D. [facility] 11:14 AM

## 2023-08-09 ENCOUNTER — Encounter: Payer: Self-pay | Admitting: Neurology

## 2023-09-02 NOTE — Progress Notes (Deleted)
Initial neurology clinic note  Kenneth [name] MRN: [id number] DOB: 1946-08-13  Referring provider: Benetta Spar*  Primary care provider: Benetta Spar, MD  Reason for consult:  stroke  Subjective:  This is Kenneth [name], a 77 y.o. ***-handed male with a medical history of HTN, psoriasis, former smoker, former EtOH abuse*** who presents to neurology clinic with stroke. The patient is accompanied by ***.  ***  Patient presented to ED on 05/07/23 for difficulty walking, facial droop, and left sided weakness. He was found by son and last known to be well 2 weeks prior. 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 Patient had ACDF of C3-C7 on 05/10/23. For his stroke, neurology recommended 3 weeks of DAPT with asa 81 mg daily and plavix 75 mg daily. 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 Echo was normal. LDL was 69. Heart monitor was suggested, but patient did not have a reliable address at time of admission.  Smoking? EtOH?***  What are current medications? DAPT?*** Statin?***  MEDICATIONS:  Outpatient Encounter Medications as of 09/11/2023  Medication Sig   albuterol (VENTOLIN HFA) 108 (90 Base) MCG/ACT inhaler Inhale 2 puffs into the lungs every 6 (six) hours as needed for shortness of breath.   amLODipine (NORVASC) 5 MG tablet Take 5 mg by mouth daily.   aspirin EC 81 MG tablet Take 1 tablet (81 mg total) by mouth daily. Swallow whole.   atorvastatin (LIPITOR) 10 MG tablet Take 10 mg by mouth at bedtime.   bisacodyl (DULCOLAX) 5 MG EC tablet Take 1 tablet (5 mg total) by mouth daily as needed for moderate constipation.    clopidogrel (PLAVIX) 75 MG tablet Take 75 mg by mouth daily.   ergocalciferol (VITAMIN D2) 1.25 MG (50000 UT) capsule Take 50,000 Units by mouth once a week.   famotidine (PEPCID) 20 MG tablet Take 1 tablet (20 mg total) by mouth daily.   furosemide (LASIX) 20 MG tablet Take 1 tablet (20 mg total) by mouth daily.   mineral oil-hydrophilic petrolatum (AQUAPHOR) ointment Apply topically as needed for dry skin. (Patient taking differently: Apply 1 Application topically as needed for dry skin.)   oxyCODONE (OXY IR/ROXICODONE) 5 MG immediate release tablet Take 1-2 tablets (5-10 mg total) by mouth every 6 (six) hours as needed for moderate pain, severe pain or breakthrough pain.   SKYRIZI PEN 150 MG/ML pen Inject 1 mL into the skin once.   No facility-administered encounter medications on file as of 09/11/2023.    PAST MEDICAL HISTORY: Past Medical History:  Diagnosis Date   Hypertension    Psoriasis     PAST SURGICAL HISTORY: Past Surgical History:  Procedure Laterality Date   ANTERIOR CERVICAL DECOMPRESSION/DISCECTOMY FUSION 4 LEVELS N/A 05/10/2023   Procedure: Cervical three to Cervical seven Anterior Cervical Decompression Fusion;  Surgeon: Coletta Memos, MD;  Location: [facility] OR;  Service: Neurosurgery;  Laterality: N/A;   HERNIA REPAIR      ALLERGIES: No Known Allergies  FAMILY HISTORY: No family history on file.  SOCIAL HISTORY: Social History   Tobacco Use   Smoking status: Former    Types: Cigarettes   Smokeless tobacco: Never  Vaping  Use   Vaping status: Never Used  Substance Use Topics   Alcohol use: Yes    Comment: a couple times a week   Drug use: Not Currently   Social History   Social History Narrative   Not on file    Objective:  Vital Signs:  There were no vitals taken for this visit.  ***  Labs and Imaging review: Internal labs: Lab Results  Component Value Date   HGBA1C 5.8 (H) 05/07/2023   Lab Results  Component Value Date   VITAMINB12 358  05/08/2023   Lab Results  Component Value Date   TSH 0.829 05/08/2023   05/08/23: Folate wnl Vit D low at 16.96  Lipid panel (05/07/23): Component     Latest Ref Rng 05/07/2023  Cholesterol     0 - 200 mg/dL 478   Triglycerides     <150 mg/dL 92   HDL Cholesterol     >40 mg/dL 47   Total CHOL/HDL Ratio     RATIO 2.7   VLDL     0 - 40 mg/dL 18   LDL (calc)     0 - 99 mg/dL 64     External labs: ***  Imaging: CT head wo contrast (05/07/23): FINDINGS: Brain:   Redemonstrated foci of chronic encephalomalacia/gliosis within the anteromedial frontal lobes bilaterally, mid right frontal lobe and left frontal operculum/left insula consistent with chronic infarcts.   Background patchy and ill-defined hypoattenuation within the cerebral white matter, nonspecific but compatible with moderate chronic small vessel ischemic disease.   There is no acute intracranial hemorrhage.   No acute demarcated cortical infarct.   No extra-axial fluid collection.   No evidence of an intracranial mass.   No midline shift.   Vascular: No hyperdense vessel.  Atherosclerotic calcifications.   Skull: No fracture or aggressive osseous lesion.   Sinuses/Orbits: No mass or acute finding within the imaged orbits. Trace mucosal thickening within the right maxillary sinus at the imaged levels (with associated chronic reactive osteitis). Small volume secretions within the right sphenoid sinus.   IMPRESSION: 1.  No evidence of an acute intracranial abnormality. 2. 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 3. Background moderate chronic small vessel ischemic changes within the cerebral white matter. 4. Mild paranasal sinus disease at the imaged levels.  MRI brain wo contrast (05/07/23): FINDINGS: Brain: There is a small focus of diffusion restriction in the left cerebellar  hemisphere consistent with a small acute infarct. There is no hemorrhage or mass effect. There is no other evidence of acute infarct   There is no acute intracranial hemorrhage or extra-axial fluid collection.   Background parenchymal volume loss with prominence of the ventricular system and extra-axial CSF spaces is unchanged. Encephalomalacia and gliosis in the bilateral frontal lobes is again seen consistent with prior infarcts. There are multiple additional small remote infarcts in the bilateral cerebellar hemispheres. Additional patchy and confluent FLAIR signal abnormality in the supratentorial white matter is consistent with moderate underlying chronic small-vessel ischemic change.   The pituitary and suprasellar region are normal. There is no mass lesion. There is no mass effect or midline shift.   Vascular: Normal flow voids.   Skull and upper cervical spine: There is no suspicious marrow signal abnormality. There is degenerative change of the upper cervical spine with multilevel severe spinal canal stenosis and cord compression, incompletely evaluated.   Sinuses/Orbits: The paranasal sinuses are clear. The globes and  orbits are unremarkable.   Other: There is trace fluid in the left mastoid tip.   IMPRESSION: 1. 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 2. Remote infarcts and background chronic small-vessel ischemic change as above. 3. 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 Consider dedicated cervical spine MRI as indicated.  MRI cervical spine wo contrast (05/07/23): FINDINGS: Evaluation is significantly limited by motion artifact. Limited utility of the sagittal sequences. The axial sequences are nearly nondiagnostic.   Alignment: Straightening of the normal cervical lordosis.   Vertebrae: No acute fracture, evidence of discitis, or  suspicious osseous lesion. Congenitally short pedicles, which narrow the AP diameter of the spinal canal.   Cord: Suspect increased T2 signal in the spinal cord at C3-C4 and C4-C5 (series 7, image 8), although this cannot be corroborated on the axial images.   Posterior Fossa, vertebral arteries, paraspinal tissues: Trace prevertebral fluid.   Disc levels:   Motion precludes evaluation of the neural foramina, although there appears to the significant facet and uncovertebral hypertrophy, likely resulting in advanced neural foraminal narrowing at most cervical levels.   C2-C3: No significant disc bulge. Mild to moderate spinal canal stenosis.   C3-C4: Disc height loss and moderate disc osteophyte complex. Severe spinal canal stenosis.   C4-C5: Disc height loss and disc osteophyte complex. Severe spinal canal stenosis.   C5-C6: Disc height loss and disc osteophyte complex. Moderate to severe spinal canal stenosis.   C6-C7: Disc height loss and mild disc osteophyte complex. Moderate spinal canal stenosis.   C7-T1: Mild disc bulge.  Mild spinal canal stenosis.   T1-T2: Disc height loss and mild disc osteophyte complex. Mild spinal canal stenosis.   IMPRESSION: 1. Evaluation is significantly limited by motion artifact, with limited utility of the sagittal sequences and nearly nondiagnostic axial sequences. Consider repeating examine the patient is better able to tolerate it. 2. Congenitally short pedicles, which narrow the AP diameter of the spinal canal, with superimposed degenerative disc disease, which causes severe spinal canal stenosis at C3-C4 and C4-C5, moderate to severe spinal canal stenosis at C5-C6, and moderate spinal canal stenosis at C6-C7. 3. Suspect increased T2 signal in the spinal cord at C3-C4 and C4-C5, although this cannot be corroborated on the axial images due to motion. 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 4. Motion precludes evaluation of the neural foramina, although there appears to the significant facet and uncovertebral hypertrophy, likely resulting in advanced neural foraminal narrowing at most cervical levels. Attention on reimaging.  MRI cervical spine wo contrast (05/08/23): FINDINGS: Despite IV medication and repeated imaging attempts this exam remains moderately degraded by motion.   Diffuse cervical spine spondylosis with severe disc space loss and bulky endplate spurring throughout results in spinal stenosis with spinal cord mass effect from C3-C4 through C6-C7. Evidence of associated spinal cord myelomalacia which is likely most pronounced at the C4-C5 level (series 5, image 7).   Furthermore, abnormal mild prevertebral fluid or edema is redemonstrated from C2-C3 through C5-C6 (confirmed on axial series 8, image 13). This is nonspecific. No evidence of underlying discitis. Patchy marrow edema in the cervical spine appears to be degenerative in nature.   IMPRESSION: 1. Little improved diagnostic image quality despite repeated imaging attempts with IV medication today.   2. Widespread advanced cervical spine degeneration and spinal stenosis C3-C4 through C6-C7 with spinal cord mass effect at each  level. 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   3. Abnormal but nonspecific prevertebral edema or fluid from the C2 to the C6 level. If there has been recent fall/trauma then consider anterior ligamentous injury and in that case recommend noncontrast cervical spine CT which would be most sensitive for cervical vertebral fracture.  CTA head and neck (05/08/23): FINDINGS: CTA NECK FINDINGS   Aortic arch: Aortic atherosclerosis. Great vessel origins are patent without significant stenosis.   Right carotid system: Atherosclerosis at the carotid bifurcation without greater than 50% stenosis.   Left  carotid system: Atherosclerosis at the carotid bifurcation without greater than 50% stenosis.   Vertebral arteries: Severe stenosis of the right vertebral artery origin. Mild left vertebral artery origin stenosis. Patent bilaterally.   Skeleton: Severe degenerative change in the cervical spine. No acute abnormality on limited assessment.   Other neck: No acute abnormality on limited assessment.   Upper chest: Emphysema.   Review of the MIP images confirms the above findings   CTA HEAD FINDINGS   Anterior circulation: Bilateral intracranial ICAs, MCAs, and ACAs are patent without proximal hemodynamically significant stenosis. 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 The anterior communicating artery is not well visualized.   Posterior circulation: Bilateral intradural vertebral arteries, basilar artery and bilateral posterior cerebral arteries are patent without proximal hemodynamically significant stenosis. Small   Venous sinuses: As permitted by contrast timing, patent.   Review of the MIP images confirms the above findings   IMPRESSION: 1. No emergent large vessel occlusion 2. Severe right vertebral artery origin stenosis. 3. 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 4. Aortic Atherosclerosis (ICD10-I70.0) and Emphysema (ICD10-J43.9). ***  Assessment/Plan:  Kenneth [name] is a 77 y.o. male who presents for evaluation of ***. *** has a relevant medical history of ***. *** neurological examination is pertinent for ***. Available diagnostic data is significant for ***. This constellation of symptoms and objective data would most likely localize to ***. ***  PLAN: -Blood work: *** ***Repeat CTA head for aneurysm monitoring  -Return to clinic ***  The  impression above as well as the plan as outlined below were extensively discussed with the patient (in the company of ***) who voiced understanding. All questions were answered to their satisfaction.  The patient was counseled on pertinent fall precautions per the printed material provided today, and as noted under the "Patient Instructions" section below.***  When available, results of the above investigations and possible further recommendations will be communicated to the patient via telephone/MyChart. Patient to call office if not contacted after expected testing turnaround time.   Total time spent reviewing records, interview, history/exam, documentation, and coordination of care on day of encounter:  *** min   Thank you for allowing me to participate in patient's care.  If I can answer any additional questions, I would be pleased to do so.  Jacquelyne Balint, MD   CC: Benetta Spar, MD [address] [place] [address]  CC: Referring provider: Benetta Spar, MD [address] [place],  [place] [address]

## 2023-09-11 ENCOUNTER — Ambulatory Visit: Payer: Medicare (Managed Care) | Admitting: Neurology

## 2023-10-07 NOTE — Progress Notes (Signed)
Initial neurology clinic note  Kenneth [name] MRN: [id number] DOB: 1945/12/04  Referring provider: Benetta Spar*  Primary care provider: Benetta Spar, MD  Reason for consult:  stroke  Subjective:  This is Mr. Kenneth [name], a 77 y.o. right-handed male with a medical history of HTN, psoriasis, current smoker, former EtOH abuse who presents to neurology clinic with stroke. The patient is accompanied by brother.  Patient presented to ED on 05/07/23 for difficulty walking, facial droop, and left sided weakness. He was found by son and last known to be well 2 weeks prior. 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 Patient had ACDF of C3-C7 on 05/10/23. For his stroke, neurology recommended 3 weeks of DAPT with asa 81 mg daily and plavix 75 mg daily. Patient continues to be on both per nursing home report. He is also on lipitor 10 mg daily.  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 Echo was normal. LDL was 69. Heart monitor was suggested, but patient did not have a reliable address at time of admission.  He was falling a lot (at least daily) prior to the hospitalization and was very weak. Since patient has left the hospital, he has been in rehab facility. He plans to stay there for the time being. He cannot walk well. When he does walk, he walks with a walker. He has not fallen since leaving the hospital. His arms are weak bilaterally. He still feels like the left side of his face is "drawn up."   Patient continues to smoke. EtOH use: none  MEDICATIONS:  Outpatient Encounter Medications as of 10/18/2023  Medication Sig   albuterol (VENTOLIN  HFA) 108 (90 Base) MCG/ACT inhaler Inhale 2 puffs into the lungs every 6 (six) hours as needed for shortness of breath.   amLODipine (NORVASC) 5 MG tablet Take 5 mg by mouth daily.   aspirin EC 81 MG tablet Take 1 tablet (81 mg total) by mouth daily. Swallow whole.   atorvastatin (LIPITOR) 10 MG tablet Take 10 mg by mouth at bedtime.   bisacodyl (DULCOLAX) 5 MG EC tablet Take 1 tablet (5 mg total) by mouth daily as needed for moderate constipation.   clopidogrel (PLAVIX) 75 MG tablet Take 75 mg by mouth daily.   ergocalciferol (VITAMIN D2) 1.25 MG (50000 UT) capsule Take 50,000 Units by mouth once a week.   famotidine (PEPCID) 20 MG tablet Take 1 tablet (20 mg total) by mouth daily.   furosemide (LASIX) 20 MG tablet Take 1 tablet (20 mg total) by mouth daily.   mineral oil-hydrophilic petrolatum (AQUAPHOR) ointment Apply topically as needed for dry skin. (Patient taking differently: Apply 1 Application topically as needed for dry skin.)   oxyCODONE (OXY IR/ROXICODONE) 5 MG immediate release tablet Take 1-2 tablets (5-10 mg total) by mouth every 6 (six) hours as needed for moderate pain, severe pain or breakthrough pain.   polyethylene glycol (MIRALAX / GLYCOLAX) 17 g packet Take 17 g by mouth daily.   SKYRIZI PEN 150 MG/ML pen Inject 1 mL into the skin once.   No facility-administered encounter medications on file as of 10/18/2023.    PAST MEDICAL HISTORY: Past Medical History:  Diagnosis Date   Hypertension    Psoriasis     PAST SURGICAL HISTORY: Past Surgical History:  Procedure Laterality Date   ANTERIOR CERVICAL DECOMPRESSION/DISCECTOMY FUSION 4 LEVELS N/A 05/10/2023   Procedure: Cervical three to Cervical seven Anterior Cervical Decompression Fusion;  Surgeon: Coletta Memos, MD;  Location[facility];  Service: Neurosurgery;  Laterality: N/A;   HERNIA REPAIR      ALLERGIES: No Known Allergies  FAMILY HISTORY: History reviewed. No pertinent family history.  SOCIAL HISTORY: Social  History   Tobacco Use   Smoking status: Former    Types: Cigarettes   Smokeless tobacco: Never   Tobacco comments:    3 cigs a day  Vaping Use   Vaping status: Never Used  Substance Use Topics   Alcohol use: Yes    Comment: a couple times a week   Drug use: Not Currently   Social History   Social History Narrative   Are you right handed or left handed? Right   Are you currently employed ?    What is your current occupation? retired   Do you live at home alone?   Who lives with you? At facility    What type of home do you live in: 1 story or 2 story? one    Caffiene 2 cups a day    Objective:  Vital Signs:  BP 94/61   Pulse 69   Ht 5\' 5"  (1.651 m)   Wt 135 lb (61.2 kg)   SpO2 99%   BMI 22.47 kg/m   General: General appearance: Awake and alert. No distress. Cooperative with exam.  Skin: No obvious rash or jaundice. HEENT: Atraumatic. Anicteric. Lungs: Non-labored breathing on room air  Heart: Regular. No carotid bruits. Extremities: No edema. No obvious deformity.  Musculoskeletal: No obvious joint swelling. Psych: Affect appropriate.  Neurological: Mental Status: Alert. Speech fluent. No pseudobulbar affect Cranial Nerves: CNII: No RAPD. Visual fields intact. CNIII, IV, VI: PERRL. No nystagmus. EOMI. CN V: Facial sensation intact bilaterally to fine touch. CN VII: Facial muscles symmetric and strong. No ptosis at rest. CN VIII: Hears finger rub well bilaterally. CN IX: No hypophonia. CN X: Palate elevates symmetrically. CN XI: Full strength shoulder shrug bilaterally. CN XII: Tongue protrusion full and midline. No atrophy or fasciculations. No significant dysarthria Motor: Tone is mildly increased in all extremities.  Individual muscle group testing (MRC grade out of 5):  Movement     Neck flexion 5-    Neck extension 5-     Right Left   Shoulder abduction 5 4+   Elbow flexion 5 4+   Elbow extension 4+ 5   Finger abduction - FDI 1 1   Finger  abduction - ADM 1 1   Finger extension 4- 4-   Finger distal flexion - 2/3 3 3    Finger distal flexion - 4/5 3 3    Thumb flexion - FPL 3 5-    Hip flexion 4 4   Hip extension 5 5   Hip adduction 5 5   Hip abduction 5 5   Knee extension 5 5   Knee flexion 4+ 4+   Dorsiflexion 5 5   Plantarflexion 5 5     Reflexes:  Right Left   Bicep 2+ 2+   Tricep 2+ 2+   BrRad 2+ 2+   Knee 3+ 3+   Ankle 1+ 1+    Sensation: Pinprick: Diminished in left face, right arm, and right leg Coordination: Intact finger-to- nose-finger  bilaterally.  Gait: Unable to safely ambulate today   Labs and Imaging review: Internal labs: Lab Results  Component Value Date   HGBA1C 5.8 (H) 05/07/2023   Lab Results  Component Value Date   VITAMINB12 358 05/08/2023   Lab Results  Component Value Date   TSH 0.829 05/08/2023   05/08/23: Folate wnl Vit D low at 16.96  Lipid panel (05/07/23): Component     Latest Ref Rng 05/07/2023  Cholesterol     0 - 200 mg/dL 578   Triglycerides     <150 mg/dL 92   HDL Cholesterol     >40 mg/dL 47   Total CHOL/HDL Ratio     RATIO 2.7   VLDL     0 - 40 mg/dL 18   LDL (calc)     0 - 99 mg/dL 64     Imaging: CT head wo contrast (05/07/23): FINDINGS: Brain:   Redemonstrated foci of chronic encephalomalacia/gliosis within the anteromedial frontal lobes bilaterally, mid right frontal lobe and left frontal operculum/left insula consistent with chronic infarcts.   Background patchy and ill-defined hypoattenuation within the cerebral white matter, nonspecific but compatible with moderate chronic small vessel ischemic disease.   There is no acute intracranial hemorrhage.   No acute demarcated cortical infarct.   No extra-axial fluid collection.   No evidence of an intracranial mass.   No midline shift.   Vascular: No hyperdense vessel.  Atherosclerotic calcifications.   Skull: No fracture or aggressive osseous lesion.   Sinuses/Orbits: No mass or  acute finding within the imaged orbits. Trace mucosal thickening within the right maxillary sinus at the imaged levels (with associated chronic reactive osteitis). Small volume secretions within the right sphenoid sinus.   IMPRESSION: 1.  No evidence of an acute intracranial abnormality. 2. 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 3. Background moderate chronic small vessel ischemic changes within the cerebral white matter. 4. Mild paranasal sinus disease at the imaged levels.  MRI brain wo contrast (05/07/23): FINDINGS: Brain: There is a small focus of diffusion restriction in the left cerebellar hemisphere consistent with a small acute infarct. There is no hemorrhage or mass effect. There is no other evidence of acute infarct   There is no acute intracranial hemorrhage or extra-axial fluid collection.   Background parenchymal volume loss with prominence of the ventricular system and extra-axial CSF spaces is unchanged. Encephalomalacia and gliosis in the bilateral frontal lobes is again seen consistent with prior infarcts. There are multiple additional small remote infarcts in the bilateral cerebellar hemispheres. Additional patchy and confluent FLAIR signal abnormality in the supratentorial white matter is consistent with moderate underlying chronic small-vessel ischemic change.   The pituitary and suprasellar region are normal. There is no mass lesion. There is no mass effect or midline shift.   Vascular: Normal flow voids.   Skull and upper cervical spine: There is no suspicious marrow signal abnormality. There is degenerative change of the upper cervical spine with multilevel severe spinal canal stenosis and cord compression, incompletely evaluated.   Sinuses/Orbits: The paranasal sinuses are clear. The globes and orbits are unremarkable.   Other:  There is trace fluid in the left mastoid tip.   IMPRESSION: 1. 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 2. Remote infarcts and background chronic small-vessel ischemic change as above. 3. 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  Consider dedicated cervical spine MRI as indicated.  MRI cervical spine wo contrast (05/07/23): FINDINGS: Evaluation is significantly limited by motion artifact. Limited utility of the sagittal sequences. The axial sequences are nearly nondiagnostic.   Alignment: Straightening of the normal cervical lordosis.   Vertebrae: No acute fracture, evidence of discitis, or suspicious osseous lesion. Congenitally short pedicles, which narrow the AP diameter of the spinal canal.   Cord: Suspect increased T2 signal in the spinal cord at C3-C4 and C4-C5 (series 7, image 8), although this cannot be corroborated on the axial images.   Posterior Fossa, vertebral arteries, paraspinal tissues: Trace prevertebral fluid.   Disc levels:   Motion precludes evaluation of the neural foramina, although there appears to the significant facet and uncovertebral hypertrophy, likely resulting in advanced neural foraminal narrowing at most cervical levels.   C2-C3: No significant disc bulge. Mild to moderate spinal canal stenosis.   C3-C4: Disc height loss and moderate disc osteophyte complex. Severe spinal canal stenosis.   C4-C5: Disc height loss and disc osteophyte complex. Severe spinal canal stenosis.   C5-C6: Disc height loss and disc osteophyte complex. Moderate to severe spinal canal stenosis.   C6-C7: Disc height loss and mild disc osteophyte complex. Moderate spinal canal stenosis.   C7-T1: Mild disc bulge.  Mild spinal canal stenosis.   T1-T2: Disc height loss and mild disc osteophyte complex. Mild spinal canal stenosis.    IMPRESSION: 1. Evaluation is significantly limited by motion artifact, with limited utility of the sagittal sequences and nearly nondiagnostic axial sequences. Consider repeating examine the patient is better able to tolerate it. 2. Congenitally short pedicles, which narrow the AP diameter of the spinal canal, with superimposed degenerative disc disease, which causes severe spinal canal stenosis at C3-C4 and C4-C5, moderate to severe spinal canal stenosis at C5-C6, and moderate spinal canal stenosis at C6-C7. 3. Suspect increased T2 signal in the spinal cord at C3-C4 and C4-C5, although this cannot be corroborated on the axial images due to motion. 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 4. Motion precludes evaluation of the neural foramina, although there appears to the significant facet and uncovertebral hypertrophy, likely resulting in advanced neural foraminal narrowing at most cervical levels. Attention on reimaging.  MRI cervical spine wo contrast (05/08/23): FINDINGS: Despite IV medication and repeated imaging attempts this exam remains moderately degraded by motion.   Diffuse cervical spine spondylosis with severe disc space loss and bulky endplate spurring throughout results in spinal stenosis with spinal cord mass effect from C3-C4 through C6-C7. Evidence of associated spinal cord myelomalacia which is likely most pronounced at the C4-C5 level (series 5, image 7).   Furthermore, abnormal mild prevertebral fluid or edema is redemonstrated from C2-C3 through C5-C6 (confirmed on axial series 8, image 13). This is nonspecific. No evidence of underlying discitis. Patchy marrow edema in the cervical spine appears to be degenerative in nature.   IMPRESSION: 1. Little improved diagnostic image quality despite repeated imaging attempts with IV medication today.   2. Widespread advanced cervical spine  degeneration and spinal stenosis C3-C4 through C6-C7 with spinal cord mass effect at each level. 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   3. Abnormal but nonspecific prevertebral edema or fluid from the C2 to the C6 level. If there has been recent fall/trauma then consider anterior ligamentous injury and in that case recommend noncontrast cervical spine CT which would be most sensitive for cervical vertebral  fracture.  CTA head and neck (05/08/23): FINDINGS: CTA NECK FINDINGS   Aortic arch: Aortic atherosclerosis. Great vessel origins are patent without significant stenosis.   Right carotid system: Atherosclerosis at the carotid bifurcation without greater than 50% stenosis.   Left carotid system: Atherosclerosis at the carotid bifurcation without greater than 50% stenosis.   Vertebral arteries: Severe stenosis of the right vertebral artery origin. Mild left vertebral artery origin stenosis. Patent bilaterally.   Skeleton: Severe degenerative change in the cervical spine. No acute abnormality on limited assessment.   Other neck: No acute abnormality on limited assessment.   Upper chest: Emphysema.   Review of the MIP images confirms the above findings   CTA HEAD FINDINGS   Anterior circulation: Bilateral intracranial ICAs, MCAs, and ACAs are patent without proximal hemodynamically significant stenosis. 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 The anterior communicating artery is not well visualized.   Posterior circulation: Bilateral intradural vertebral arteries, basilar artery and bilateral posterior cerebral arteries are patent without proximal hemodynamically significant stenosis. Small   Venous sinuses: As permitted by contrast timing, patent.   Review of the MIP images confirms the above findings    IMPRESSION: 1. No emergent large vessel occlusion 2. Severe right vertebral artery origin stenosis. 3. 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 4. Aortic Atherosclerosis (ICD10-I70.0) and Emphysema (ICD10-J43.9).  Assessment/Plan:  Kenneth [name] is a 77 y.o. male who presents for evaluation of weakness, numbness, imbalance, and gait instability in setting of recent stroke and severe cervical spine stenosis. He has a relevant medical history of HTN, psoriasis, current smoker, former EtOH abuse. His neurological examination is pertinent for diffuse weakness, more distal in upper extremities and more proximal in lower extremities with patchy sensory loss in left face but right arm and leg. Available diagnostic data is significant for MRI brain showing acute left cerebellar infarct (04/2023) with more extensive chronic microvascular ischemic changes. MRI cervical spine showed severe cervical stenosis, now s/p ACDF of C3-C7 on 05/10/23.   This is a complex case with a difficult physical examination. It is unclear to me what deficits were present prior to hospitalization in 04/2023 and which were more acute. The small stroke in the left cerebellum does not well explain any of his findings. The severe cervical spine compression could explain arm and leg weakness and imbalance with gait abnormalities, but would not account for his left facial numbness. In any case, we discussed prevention of future stroke with smoking cessation and will stop the plavix as this should have been stopped after 21 days. We also discussed that working with PT and OT at his rehab facility would give him the greatest shot at recovery, though how much recovery he will get is currently unclear.  PLAN: -Repeat CTA head for aneurysm monitoring -Aspirin 81 mg daily -Stop plavix 75 mg daily -Atorvastatin 10 mg daily -Continue  PT/OT -Follow up with Dr. Franky Macho in NSGY - did not occur as requested in DC summary -Stroke warning signs discussed -Smoking cessation discussed  -Return to clinic in 6 months  The impression above as well as the plan as outlined below were extensively discussed with the patient (in the company of brother) who voiced understanding. All questions were answered to their satisfaction.  The patient was counseled on pertinent fall precautions per the printed material provided today, and  as noted under the "Patient Instructions" section below.  When available, results of the above investigations and possible further recommendations will be communicated to the patient via telephone/MyChart. Patient to call office if not contacted after expected testing turnaround time.   Total time spent reviewing records, interview, history/exam, documentation, and coordination of care on day of encounter:  65 min   Thank you for allowing me to participate in patient's care.  If I can answer any additional questions, I would be pleased to do so.  Jacquelyne Balint, MD   CC: Benetta Spar, MD [address] [place] [address]  CC: Referring provider: Benetta Spar, MD [address] [place],  [place] [address]

## 2023-10-18 ENCOUNTER — Ambulatory Visit (INDEPENDENT_AMBULATORY_CARE_PROVIDER_SITE_OTHER): Payer: 59 | Admitting: Neurology

## 2023-10-18 ENCOUNTER — Encounter: Payer: Self-pay | Admitting: Neurology

## 2023-10-18 VITALS — BP 94/61 | HR 69 | Ht 65.0 in | Wt 135.0 lb

## 2023-10-18 DIAGNOSIS — M4802 Spinal stenosis, cervical region: Secondary | ICD-10-CM | POA: Diagnosis not present

## 2023-10-18 DIAGNOSIS — R531 Weakness: Secondary | ICD-10-CM | POA: Diagnosis not present

## 2023-10-18 DIAGNOSIS — R200 Anesthesia of skin: Secondary | ICD-10-CM

## 2023-10-18 DIAGNOSIS — I6381 Other cerebral infarction due to occlusion or stenosis of small artery: Secondary | ICD-10-CM

## 2023-10-18 DIAGNOSIS — R2689 Other abnormalities of gait and mobility: Secondary | ICD-10-CM | POA: Diagnosis not present

## 2023-10-18 DIAGNOSIS — I671 Cerebral aneurysm, nonruptured: Secondary | ICD-10-CM

## 2023-10-18 DIAGNOSIS — R2681 Unsteadiness on feet: Secondary | ICD-10-CM

## 2023-10-18 NOTE — Patient Instructions (Addendum)
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 Franky Macho in [facility] - did not occur as requested in DC summary  Please cut back and stop smoking.  If you have new difficulty speaking, face droop, numbness on one side of the body, weakness on one side of the body, or dizziness/imbalance, this could be the sign of a stroke. Don't wait, please call EMS and be evaluated at the nearest emergency room.  The physicians and staff at [facility] are committed to providing excellent care. You may receive a survey requesting feedback about your experience at our office. We strive to receive "very good" responses to the survey questions. If you feel that your experience would prevent you from giving the office a "very good " response, please contact our office to try to remedy the situation. We may be reached at [phone]. Thank you for taking the time out of your busy day to complete the survey.  Jacquelyne Balint, MD [facility]  Preventing Falls at [facility] are common, often dreaded events in the lives of older people. Aside from the obvious injuries and even death that may result, fall can cause wide-ranging consequences including loss of independence, mental decline, decreased activity and mobility. Younger people are also at risk of falling, especially those with chronic illnesses and fatigue.  Ways to reduce risk for falling Examine diet and medications. Warm foods and alcohol dilate blood vessels, which can lead to dizziness when standing. Sleep aids, antidepressants and pain medications can also increase the likelihood of a fall.  Get a vision exam. Poor vision, cataracts and glaucoma increase the chances of falling.  Check foot gear. Shoes should fit snugly and have a sturdy, nonskid sole and a broad, low heel  Participate in a physician-approved exercise program to build and maintain  muscle strength and improve balance and coordination. Programs that use ankle weights or stretch bands are excellent for muscle-strengthening. Water aerobics programs and low-impact Tai Chi programs have also been shown to improve balance and coordination.  Increase vitamin D intake. Vitamin D improves muscle strength and increases the amount of calcium the body is able to absorb and deposit in bones.  How to prevent falls from common hazards Floors - Remove all loose wires, cords, and throw rugs. Minimize clutter. Make sure rugs are anchored and smooth. Keep furniture in its usual place.  Chairs -- Use chairs with straight backs, armrests and firm seats. Add firm cushions to existing pieces to add height.  Bathroom - Install grab bars and non-skid tape in the tub or shower. Use a bathtub transfer bench or a shower chair with a back support Use an elevated toilet seat and/or safety rails to assist standing from a low surface. Do not use towel racks or bathroom tissue holders to help you stand.  Lighting - Make sure halls, stairways, and entrances are well-lit. Install a night light in your bathroom or hallway. Make sure there is a light switch at the top and bottom of the staircase. Turn lights on if you get up in the middle of the night. Make sure lamps or light switches are within reach of the bed if you have to get up during the night.  Kitchen - Install non-skid rubber mats near the sink and stove. Clean spills immediately. Store frequently used utensils, pots, pans between waist and eye level. This helps prevent reaching and bending. Sit  when getting things out of lower cupboards.  Living room/ Bedrooms - Place furniture with wide spaces in between, giving enough room to move around. Establish a route through the living room that gives you something to hold onto as you walk.  Stairs - Make sure treads, rails, and rugs are secure. Install a rail on both sides of the stairs. If stairs are a  threat, it might be helpful to arrange most of your activities on the lower level to reduce the number of times you must climb the stairs.  Entrances and doorways - Install metal handles on the walls adjacent to the doorknobs of all doors to make it more secure as you travel through the doorway.  Tips for maintaining balance Keep at least one hand free at all times. Try using a backpack or fanny pack to hold things rather than carrying them in your hands. Never carry objects in both hands when walking as this interferes with keeping your balance.  Attempt to swing both arms from front to back while walking. This might require a conscious effort if Parkinson's disease has diminished your movement. It will, however, help you to maintain balance and posture, and reduce fatigue.  Consciously lift your feet off of the ground when walking. Shuffling and dragging of the feet is a common culprit in losing your balance.  When trying to navigate turns, use a "U" technique of facing forward and making a wide turn, rather than pivoting sharply.  Try to stand with your feet shoulder-length apart. When your feet are close together for any length of time, you increase your risk of losing your balance and falling.  Do one thing at a time. Don't try to walk and accomplish another task, such as reading or looking around. The decrease in your automatic reflexes complicates motor function, so the less distraction, the better.  Do not wear rubber or gripping soled shoes, they might "catch" on the floor and cause tripping.  Move slowly when changing positions. Use deliberate, concentrated movements and, if needed, use a grab bar or walking aid. Count 15 seconds between each movement. For example, when rising from a seated position, wait 15 seconds after standing to begin walking.  If balance is a continuous problem, you might want to consider a walking aid such as a cane, walking stick, or walker. Once you've  mastered walking with help, you might be ready to try it on your own again.

## 2023-10-18 NOTE — Addendum Note (Signed)
Addended by: Lenise Herald on: 10/18/2023 02:09 PM   Modules accepted: Orders

## 2023-10-21 NOTE — Addendum Note (Signed)
Addended by: Lenise Herald on: 10/21/2023 02:17 PM   Modules accepted: Orders

## 2024-01-30 ENCOUNTER — Other Ambulatory Visit (HOSPITAL_COMMUNITY): Payer: Self-pay | Admitting: Neurosurgery

## 2024-01-30 DIAGNOSIS — M4802 Spinal stenosis, cervical region: Secondary | ICD-10-CM

## 2024-02-07 ENCOUNTER — Ambulatory Visit (HOSPITAL_COMMUNITY)
Admission: RE | Admit: 2024-02-07 | Discharge: 2024-02-07 | Disposition: A | Discharge status: Home / Self Care | Source: Ambulatory Visit | Attending: Neurosurgery | Admitting: Neurosurgery

## 2024-02-07 DIAGNOSIS — M4802 Spinal stenosis, cervical region: Secondary | ICD-10-CM | POA: Insufficient documentation

## 2024-04-13 DIAGNOSIS — M5000 Cervical disc disorder with myelopathy, unspecified cervical region: Secondary | ICD-10-CM | POA: Diagnosis not present

## 2024-04-13 DIAGNOSIS — I639 Cerebral infarction, unspecified: Secondary | ICD-10-CM | POA: Diagnosis not present

## 2024-04-13 DIAGNOSIS — M6281 Muscle weakness (generalized): Secondary | ICD-10-CM | POA: Diagnosis not present

## 2024-04-13 DIAGNOSIS — R278 Other lack of coordination: Secondary | ICD-10-CM | POA: Diagnosis not present

## 2024-04-13 DIAGNOSIS — R270 Ataxia, unspecified: Secondary | ICD-10-CM | POA: Diagnosis not present

## 2024-04-13 DIAGNOSIS — R279 Unspecified lack of coordination: Secondary | ICD-10-CM | POA: Diagnosis not present

## 2024-04-14 DIAGNOSIS — R279 Unspecified lack of coordination: Secondary | ICD-10-CM | POA: Diagnosis not present

## 2024-04-14 DIAGNOSIS — I639 Cerebral infarction, unspecified: Secondary | ICD-10-CM | POA: Diagnosis not present

## 2024-04-14 DIAGNOSIS — M6281 Muscle weakness (generalized): Secondary | ICD-10-CM | POA: Diagnosis not present

## 2024-04-14 DIAGNOSIS — R270 Ataxia, unspecified: Secondary | ICD-10-CM | POA: Diagnosis not present

## 2024-04-14 DIAGNOSIS — M5000 Cervical disc disorder with myelopathy, unspecified cervical region: Secondary | ICD-10-CM | POA: Diagnosis not present

## 2024-04-14 DIAGNOSIS — R278 Other lack of coordination: Secondary | ICD-10-CM | POA: Diagnosis not present

## 2024-04-15 DIAGNOSIS — M6281 Muscle weakness (generalized): Secondary | ICD-10-CM | POA: Diagnosis not present

## 2024-04-15 DIAGNOSIS — I639 Cerebral infarction, unspecified: Secondary | ICD-10-CM | POA: Diagnosis not present

## 2024-04-15 DIAGNOSIS — M5000 Cervical disc disorder with myelopathy, unspecified cervical region: Secondary | ICD-10-CM | POA: Diagnosis not present

## 2024-04-15 DIAGNOSIS — R278 Other lack of coordination: Secondary | ICD-10-CM | POA: Diagnosis not present

## 2024-04-15 DIAGNOSIS — R279 Unspecified lack of coordination: Secondary | ICD-10-CM | POA: Diagnosis not present

## 2024-04-15 DIAGNOSIS — R270 Ataxia, unspecified: Secondary | ICD-10-CM | POA: Diagnosis not present

## 2024-04-16 DIAGNOSIS — R279 Unspecified lack of coordination: Secondary | ICD-10-CM | POA: Diagnosis not present

## 2024-04-16 DIAGNOSIS — R278 Other lack of coordination: Secondary | ICD-10-CM | POA: Diagnosis not present

## 2024-04-16 DIAGNOSIS — M5000 Cervical disc disorder with myelopathy, unspecified cervical region: Secondary | ICD-10-CM | POA: Diagnosis not present

## 2024-04-16 DIAGNOSIS — R270 Ataxia, unspecified: Secondary | ICD-10-CM | POA: Diagnosis not present

## 2024-04-16 DIAGNOSIS — M6281 Muscle weakness (generalized): Secondary | ICD-10-CM | POA: Diagnosis not present

## 2024-04-16 DIAGNOSIS — I639 Cerebral infarction, unspecified: Secondary | ICD-10-CM | POA: Diagnosis not present

## 2024-04-17 DIAGNOSIS — I639 Cerebral infarction, unspecified: Secondary | ICD-10-CM | POA: Diagnosis not present

## 2024-04-17 DIAGNOSIS — R278 Other lack of coordination: Secondary | ICD-10-CM | POA: Diagnosis not present

## 2024-04-17 DIAGNOSIS — M5000 Cervical disc disorder with myelopathy, unspecified cervical region: Secondary | ICD-10-CM | POA: Diagnosis not present

## 2024-04-17 DIAGNOSIS — R270 Ataxia, unspecified: Secondary | ICD-10-CM | POA: Diagnosis not present

## 2024-04-17 DIAGNOSIS — M6281 Muscle weakness (generalized): Secondary | ICD-10-CM | POA: Diagnosis not present

## 2024-04-17 DIAGNOSIS — R279 Unspecified lack of coordination: Secondary | ICD-10-CM | POA: Diagnosis not present

## 2024-04-20 DIAGNOSIS — M5000 Cervical disc disorder with myelopathy, unspecified cervical region: Secondary | ICD-10-CM | POA: Diagnosis not present

## 2024-04-20 DIAGNOSIS — R270 Ataxia, unspecified: Secondary | ICD-10-CM | POA: Diagnosis not present

## 2024-04-20 DIAGNOSIS — M6281 Muscle weakness (generalized): Secondary | ICD-10-CM | POA: Diagnosis not present

## 2024-04-20 DIAGNOSIS — I639 Cerebral infarction, unspecified: Secondary | ICD-10-CM | POA: Diagnosis not present

## 2024-04-20 DIAGNOSIS — R278 Other lack of coordination: Secondary | ICD-10-CM | POA: Diagnosis not present

## 2024-04-20 DIAGNOSIS — R279 Unspecified lack of coordination: Secondary | ICD-10-CM | POA: Diagnosis not present

## 2024-04-21 DIAGNOSIS — I639 Cerebral infarction, unspecified: Secondary | ICD-10-CM | POA: Diagnosis not present

## 2024-04-21 DIAGNOSIS — R270 Ataxia, unspecified: Secondary | ICD-10-CM | POA: Diagnosis not present

## 2024-04-21 DIAGNOSIS — M6281 Muscle weakness (generalized): Secondary | ICD-10-CM | POA: Diagnosis not present

## 2024-04-21 DIAGNOSIS — R279 Unspecified lack of coordination: Secondary | ICD-10-CM | POA: Diagnosis not present

## 2024-04-21 DIAGNOSIS — R278 Other lack of coordination: Secondary | ICD-10-CM | POA: Diagnosis not present

## 2024-04-21 DIAGNOSIS — M5000 Cervical disc disorder with myelopathy, unspecified cervical region: Secondary | ICD-10-CM | POA: Diagnosis not present

## 2024-04-22 DIAGNOSIS — I639 Cerebral infarction, unspecified: Secondary | ICD-10-CM | POA: Diagnosis not present

## 2024-04-22 DIAGNOSIS — M6281 Muscle weakness (generalized): Secondary | ICD-10-CM | POA: Diagnosis not present

## 2024-04-22 DIAGNOSIS — M5000 Cervical disc disorder with myelopathy, unspecified cervical region: Secondary | ICD-10-CM | POA: Diagnosis not present

## 2024-04-22 DIAGNOSIS — R270 Ataxia, unspecified: Secondary | ICD-10-CM | POA: Diagnosis not present

## 2024-04-22 DIAGNOSIS — R278 Other lack of coordination: Secondary | ICD-10-CM | POA: Diagnosis not present

## 2024-04-22 DIAGNOSIS — R279 Unspecified lack of coordination: Secondary | ICD-10-CM | POA: Diagnosis not present

## 2024-04-24 DIAGNOSIS — R279 Unspecified lack of coordination: Secondary | ICD-10-CM | POA: Diagnosis not present

## 2024-04-24 DIAGNOSIS — I639 Cerebral infarction, unspecified: Secondary | ICD-10-CM | POA: Diagnosis not present

## 2024-04-24 DIAGNOSIS — R278 Other lack of coordination: Secondary | ICD-10-CM | POA: Diagnosis not present

## 2024-04-24 DIAGNOSIS — M5000 Cervical disc disorder with myelopathy, unspecified cervical region: Secondary | ICD-10-CM | POA: Diagnosis not present

## 2024-04-24 DIAGNOSIS — R270 Ataxia, unspecified: Secondary | ICD-10-CM | POA: Diagnosis not present

## 2024-04-24 DIAGNOSIS — M6281 Muscle weakness (generalized): Secondary | ICD-10-CM | POA: Diagnosis not present

## 2024-04-25 DIAGNOSIS — R278 Other lack of coordination: Secondary | ICD-10-CM | POA: Diagnosis not present

## 2024-04-25 DIAGNOSIS — M5000 Cervical disc disorder with myelopathy, unspecified cervical region: Secondary | ICD-10-CM | POA: Diagnosis not present

## 2024-04-25 DIAGNOSIS — M6281 Muscle weakness (generalized): Secondary | ICD-10-CM | POA: Diagnosis not present

## 2024-04-25 DIAGNOSIS — I639 Cerebral infarction, unspecified: Secondary | ICD-10-CM | POA: Diagnosis not present

## 2024-04-25 DIAGNOSIS — R279 Unspecified lack of coordination: Secondary | ICD-10-CM | POA: Diagnosis not present

## 2024-04-25 DIAGNOSIS — R270 Ataxia, unspecified: Secondary | ICD-10-CM | POA: Diagnosis not present

## 2024-04-27 DIAGNOSIS — R270 Ataxia, unspecified: Secondary | ICD-10-CM | POA: Diagnosis not present

## 2024-04-27 DIAGNOSIS — R279 Unspecified lack of coordination: Secondary | ICD-10-CM | POA: Diagnosis not present

## 2024-04-27 DIAGNOSIS — M5000 Cervical disc disorder with myelopathy, unspecified cervical region: Secondary | ICD-10-CM | POA: Diagnosis not present

## 2024-04-27 DIAGNOSIS — M6281 Muscle weakness (generalized): Secondary | ICD-10-CM | POA: Diagnosis not present

## 2024-04-27 DIAGNOSIS — I639 Cerebral infarction, unspecified: Secondary | ICD-10-CM | POA: Diagnosis not present

## 2024-04-27 DIAGNOSIS — R278 Other lack of coordination: Secondary | ICD-10-CM | POA: Diagnosis not present

## 2024-04-29 DIAGNOSIS — I639 Cerebral infarction, unspecified: Secondary | ICD-10-CM | POA: Diagnosis not present

## 2024-04-29 DIAGNOSIS — M5000 Cervical disc disorder with myelopathy, unspecified cervical region: Secondary | ICD-10-CM | POA: Diagnosis not present

## 2024-04-29 DIAGNOSIS — R279 Unspecified lack of coordination: Secondary | ICD-10-CM | POA: Diagnosis not present

## 2024-04-29 DIAGNOSIS — R278 Other lack of coordination: Secondary | ICD-10-CM | POA: Diagnosis not present

## 2024-04-29 DIAGNOSIS — R270 Ataxia, unspecified: Secondary | ICD-10-CM | POA: Diagnosis not present

## 2024-04-29 DIAGNOSIS — M6281 Muscle weakness (generalized): Secondary | ICD-10-CM | POA: Diagnosis not present

## 2024-04-30 DIAGNOSIS — M6281 Muscle weakness (generalized): Secondary | ICD-10-CM | POA: Diagnosis not present

## 2024-04-30 DIAGNOSIS — M5000 Cervical disc disorder with myelopathy, unspecified cervical region: Secondary | ICD-10-CM | POA: Diagnosis not present

## 2024-04-30 DIAGNOSIS — R278 Other lack of coordination: Secondary | ICD-10-CM | POA: Diagnosis not present

## 2024-04-30 DIAGNOSIS — R270 Ataxia, unspecified: Secondary | ICD-10-CM | POA: Diagnosis not present

## 2024-04-30 DIAGNOSIS — R279 Unspecified lack of coordination: Secondary | ICD-10-CM | POA: Diagnosis not present

## 2024-04-30 DIAGNOSIS — I639 Cerebral infarction, unspecified: Secondary | ICD-10-CM | POA: Diagnosis not present

## 2024-05-01 DIAGNOSIS — M5000 Cervical disc disorder with myelopathy, unspecified cervical region: Secondary | ICD-10-CM | POA: Diagnosis not present

## 2024-05-01 DIAGNOSIS — I639 Cerebral infarction, unspecified: Secondary | ICD-10-CM | POA: Diagnosis not present

## 2024-05-01 DIAGNOSIS — M6281 Muscle weakness (generalized): Secondary | ICD-10-CM | POA: Diagnosis not present

## 2024-05-01 DIAGNOSIS — R270 Ataxia, unspecified: Secondary | ICD-10-CM | POA: Diagnosis not present

## 2024-05-01 DIAGNOSIS — R278 Other lack of coordination: Secondary | ICD-10-CM | POA: Diagnosis not present

## 2024-05-01 DIAGNOSIS — R279 Unspecified lack of coordination: Secondary | ICD-10-CM | POA: Diagnosis not present

## 2024-05-04 DIAGNOSIS — M6281 Muscle weakness (generalized): Secondary | ICD-10-CM | POA: Diagnosis not present

## 2024-05-04 DIAGNOSIS — M5000 Cervical disc disorder with myelopathy, unspecified cervical region: Secondary | ICD-10-CM | POA: Diagnosis not present

## 2024-05-04 DIAGNOSIS — I639 Cerebral infarction, unspecified: Secondary | ICD-10-CM | POA: Diagnosis not present

## 2024-05-04 DIAGNOSIS — R279 Unspecified lack of coordination: Secondary | ICD-10-CM | POA: Diagnosis not present

## 2024-05-04 DIAGNOSIS — R270 Ataxia, unspecified: Secondary | ICD-10-CM | POA: Diagnosis not present

## 2024-05-04 DIAGNOSIS — R278 Other lack of coordination: Secondary | ICD-10-CM | POA: Diagnosis not present

## 2024-05-05 DIAGNOSIS — M5000 Cervical disc disorder with myelopathy, unspecified cervical region: Secondary | ICD-10-CM | POA: Diagnosis not present

## 2024-05-05 DIAGNOSIS — I639 Cerebral infarction, unspecified: Secondary | ICD-10-CM | POA: Diagnosis not present

## 2024-05-05 DIAGNOSIS — R279 Unspecified lack of coordination: Secondary | ICD-10-CM | POA: Diagnosis not present

## 2024-05-05 DIAGNOSIS — R270 Ataxia, unspecified: Secondary | ICD-10-CM | POA: Diagnosis not present

## 2024-05-05 DIAGNOSIS — M6281 Muscle weakness (generalized): Secondary | ICD-10-CM | POA: Diagnosis not present

## 2024-05-05 DIAGNOSIS — R278 Other lack of coordination: Secondary | ICD-10-CM | POA: Diagnosis not present

## 2024-05-06 DIAGNOSIS — R279 Unspecified lack of coordination: Secondary | ICD-10-CM | POA: Diagnosis not present

## 2024-05-06 DIAGNOSIS — R270 Ataxia, unspecified: Secondary | ICD-10-CM | POA: Diagnosis not present

## 2024-05-06 DIAGNOSIS — M6281 Muscle weakness (generalized): Secondary | ICD-10-CM | POA: Diagnosis not present

## 2024-05-06 DIAGNOSIS — I639 Cerebral infarction, unspecified: Secondary | ICD-10-CM | POA: Diagnosis not present

## 2024-05-06 DIAGNOSIS — M5000 Cervical disc disorder with myelopathy, unspecified cervical region: Secondary | ICD-10-CM | POA: Diagnosis not present

## 2024-05-06 DIAGNOSIS — R278 Other lack of coordination: Secondary | ICD-10-CM | POA: Diagnosis not present

## 2024-05-07 DIAGNOSIS — I639 Cerebral infarction, unspecified: Secondary | ICD-10-CM | POA: Diagnosis not present

## 2024-05-07 DIAGNOSIS — R278 Other lack of coordination: Secondary | ICD-10-CM | POA: Diagnosis not present

## 2024-05-07 DIAGNOSIS — R279 Unspecified lack of coordination: Secondary | ICD-10-CM | POA: Diagnosis not present

## 2024-05-07 DIAGNOSIS — R270 Ataxia, unspecified: Secondary | ICD-10-CM | POA: Diagnosis not present

## 2024-05-07 DIAGNOSIS — M6281 Muscle weakness (generalized): Secondary | ICD-10-CM | POA: Diagnosis not present

## 2024-05-07 DIAGNOSIS — M5000 Cervical disc disorder with myelopathy, unspecified cervical region: Secondary | ICD-10-CM | POA: Diagnosis not present

## 2024-05-08 DIAGNOSIS — R278 Other lack of coordination: Secondary | ICD-10-CM | POA: Diagnosis not present

## 2024-05-08 DIAGNOSIS — R270 Ataxia, unspecified: Secondary | ICD-10-CM | POA: Diagnosis not present

## 2024-05-08 DIAGNOSIS — M5000 Cervical disc disorder with myelopathy, unspecified cervical region: Secondary | ICD-10-CM | POA: Diagnosis not present

## 2024-05-08 DIAGNOSIS — M6281 Muscle weakness (generalized): Secondary | ICD-10-CM | POA: Diagnosis not present

## 2024-05-08 DIAGNOSIS — I639 Cerebral infarction, unspecified: Secondary | ICD-10-CM | POA: Diagnosis not present

## 2024-05-08 DIAGNOSIS — R279 Unspecified lack of coordination: Secondary | ICD-10-CM | POA: Diagnosis not present

## 2024-05-09 DIAGNOSIS — M6281 Muscle weakness (generalized): Secondary | ICD-10-CM | POA: Diagnosis not present

## 2024-05-09 DIAGNOSIS — I639 Cerebral infarction, unspecified: Secondary | ICD-10-CM | POA: Diagnosis not present

## 2024-05-09 DIAGNOSIS — R278 Other lack of coordination: Secondary | ICD-10-CM | POA: Diagnosis not present

## 2024-05-09 DIAGNOSIS — R270 Ataxia, unspecified: Secondary | ICD-10-CM | POA: Diagnosis not present

## 2024-05-09 DIAGNOSIS — R279 Unspecified lack of coordination: Secondary | ICD-10-CM | POA: Diagnosis not present

## 2024-05-09 DIAGNOSIS — M5000 Cervical disc disorder with myelopathy, unspecified cervical region: Secondary | ICD-10-CM | POA: Diagnosis not present

## 2024-05-11 DIAGNOSIS — R270 Ataxia, unspecified: Secondary | ICD-10-CM | POA: Diagnosis not present

## 2024-05-11 DIAGNOSIS — R279 Unspecified lack of coordination: Secondary | ICD-10-CM | POA: Diagnosis not present

## 2024-05-11 DIAGNOSIS — R278 Other lack of coordination: Secondary | ICD-10-CM | POA: Diagnosis not present

## 2024-05-11 DIAGNOSIS — M6281 Muscle weakness (generalized): Secondary | ICD-10-CM | POA: Diagnosis not present

## 2024-05-11 DIAGNOSIS — M5000 Cervical disc disorder with myelopathy, unspecified cervical region: Secondary | ICD-10-CM | POA: Diagnosis not present

## 2024-05-11 DIAGNOSIS — I639 Cerebral infarction, unspecified: Secondary | ICD-10-CM | POA: Diagnosis not present

## 2024-05-12 DIAGNOSIS — M6281 Muscle weakness (generalized): Secondary | ICD-10-CM | POA: Diagnosis not present

## 2024-05-12 DIAGNOSIS — R279 Unspecified lack of coordination: Secondary | ICD-10-CM | POA: Diagnosis not present

## 2024-05-12 DIAGNOSIS — R270 Ataxia, unspecified: Secondary | ICD-10-CM | POA: Diagnosis not present

## 2024-05-12 DIAGNOSIS — R278 Other lack of coordination: Secondary | ICD-10-CM | POA: Diagnosis not present

## 2024-05-12 DIAGNOSIS — I639 Cerebral infarction, unspecified: Secondary | ICD-10-CM | POA: Diagnosis not present

## 2024-05-12 DIAGNOSIS — M5000 Cervical disc disorder with myelopathy, unspecified cervical region: Secondary | ICD-10-CM | POA: Diagnosis not present

## 2024-05-14 DIAGNOSIS — R627 Adult failure to thrive: Secondary | ICD-10-CM | POA: Diagnosis not present

## 2024-05-14 DIAGNOSIS — I639 Cerebral infarction, unspecified: Secondary | ICD-10-CM | POA: Diagnosis not present

## 2024-05-14 DIAGNOSIS — E782 Mixed hyperlipidemia: Secondary | ICD-10-CM | POA: Diagnosis not present

## 2024-05-15 DIAGNOSIS — R0602 Shortness of breath: Secondary | ICD-10-CM | POA: Diagnosis not present

## 2024-05-18 DIAGNOSIS — I639 Cerebral infarction, unspecified: Secondary | ICD-10-CM | POA: Diagnosis not present

## 2024-05-18 DIAGNOSIS — M5000 Cervical disc disorder with myelopathy, unspecified cervical region: Secondary | ICD-10-CM | POA: Diagnosis not present

## 2024-05-18 DIAGNOSIS — R279 Unspecified lack of coordination: Secondary | ICD-10-CM | POA: Diagnosis not present

## 2024-05-18 DIAGNOSIS — R270 Ataxia, unspecified: Secondary | ICD-10-CM | POA: Diagnosis not present

## 2024-05-18 DIAGNOSIS — M6281 Muscle weakness (generalized): Secondary | ICD-10-CM | POA: Diagnosis not present

## 2024-05-18 DIAGNOSIS — R278 Other lack of coordination: Secondary | ICD-10-CM | POA: Diagnosis not present

## 2024-05-20 DIAGNOSIS — L84 Corns and callosities: Secondary | ICD-10-CM | POA: Diagnosis not present

## 2024-05-20 DIAGNOSIS — M6281 Muscle weakness (generalized): Secondary | ICD-10-CM | POA: Diagnosis not present

## 2024-05-20 DIAGNOSIS — R270 Ataxia, unspecified: Secondary | ICD-10-CM | POA: Diagnosis not present

## 2024-05-20 DIAGNOSIS — R279 Unspecified lack of coordination: Secondary | ICD-10-CM | POA: Diagnosis not present

## 2024-05-20 DIAGNOSIS — R278 Other lack of coordination: Secondary | ICD-10-CM | POA: Diagnosis not present

## 2024-05-20 DIAGNOSIS — I639 Cerebral infarction, unspecified: Secondary | ICD-10-CM | POA: Diagnosis not present

## 2024-05-20 DIAGNOSIS — I739 Peripheral vascular disease, unspecified: Secondary | ICD-10-CM | POA: Diagnosis not present

## 2024-05-20 DIAGNOSIS — M5000 Cervical disc disorder with myelopathy, unspecified cervical region: Secondary | ICD-10-CM | POA: Diagnosis not present

## 2024-05-21 DIAGNOSIS — R278 Other lack of coordination: Secondary | ICD-10-CM | POA: Diagnosis not present

## 2024-05-21 DIAGNOSIS — R279 Unspecified lack of coordination: Secondary | ICD-10-CM | POA: Diagnosis not present

## 2024-05-21 DIAGNOSIS — I639 Cerebral infarction, unspecified: Secondary | ICD-10-CM | POA: Diagnosis not present

## 2024-05-21 DIAGNOSIS — M6281 Muscle weakness (generalized): Secondary | ICD-10-CM | POA: Diagnosis not present

## 2024-05-21 DIAGNOSIS — M5000 Cervical disc disorder with myelopathy, unspecified cervical region: Secondary | ICD-10-CM | POA: Diagnosis not present

## 2024-05-21 DIAGNOSIS — R270 Ataxia, unspecified: Secondary | ICD-10-CM | POA: Diagnosis not present

## 2024-05-22 DIAGNOSIS — R278 Other lack of coordination: Secondary | ICD-10-CM | POA: Diagnosis not present

## 2024-05-22 DIAGNOSIS — R279 Unspecified lack of coordination: Secondary | ICD-10-CM | POA: Diagnosis not present

## 2024-05-22 DIAGNOSIS — R270 Ataxia, unspecified: Secondary | ICD-10-CM | POA: Diagnosis not present

## 2024-05-22 DIAGNOSIS — M5000 Cervical disc disorder with myelopathy, unspecified cervical region: Secondary | ICD-10-CM | POA: Diagnosis not present

## 2024-05-22 DIAGNOSIS — I639 Cerebral infarction, unspecified: Secondary | ICD-10-CM | POA: Diagnosis not present

## 2024-05-22 DIAGNOSIS — M6281 Muscle weakness (generalized): Secondary | ICD-10-CM | POA: Diagnosis not present

## 2024-05-25 DIAGNOSIS — M5000 Cervical disc disorder with myelopathy, unspecified cervical region: Secondary | ICD-10-CM | POA: Diagnosis not present

## 2024-05-25 DIAGNOSIS — R278 Other lack of coordination: Secondary | ICD-10-CM | POA: Diagnosis not present

## 2024-05-25 DIAGNOSIS — I639 Cerebral infarction, unspecified: Secondary | ICD-10-CM | POA: Diagnosis not present

## 2024-05-25 DIAGNOSIS — R270 Ataxia, unspecified: Secondary | ICD-10-CM | POA: Diagnosis not present

## 2024-05-25 DIAGNOSIS — R279 Unspecified lack of coordination: Secondary | ICD-10-CM | POA: Diagnosis not present

## 2024-05-25 DIAGNOSIS — M6281 Muscle weakness (generalized): Secondary | ICD-10-CM | POA: Diagnosis not present

## 2024-05-26 DIAGNOSIS — R279 Unspecified lack of coordination: Secondary | ICD-10-CM | POA: Diagnosis not present

## 2024-05-26 DIAGNOSIS — I639 Cerebral infarction, unspecified: Secondary | ICD-10-CM | POA: Diagnosis not present

## 2024-05-26 DIAGNOSIS — R278 Other lack of coordination: Secondary | ICD-10-CM | POA: Diagnosis not present

## 2024-05-26 DIAGNOSIS — M5000 Cervical disc disorder with myelopathy, unspecified cervical region: Secondary | ICD-10-CM | POA: Diagnosis not present

## 2024-05-26 DIAGNOSIS — R270 Ataxia, unspecified: Secondary | ICD-10-CM | POA: Diagnosis not present

## 2024-05-26 DIAGNOSIS — M6281 Muscle weakness (generalized): Secondary | ICD-10-CM | POA: Diagnosis not present

## 2024-05-27 DIAGNOSIS — M6281 Muscle weakness (generalized): Secondary | ICD-10-CM | POA: Diagnosis not present

## 2024-05-27 DIAGNOSIS — M5000 Cervical disc disorder with myelopathy, unspecified cervical region: Secondary | ICD-10-CM | POA: Diagnosis not present

## 2024-05-27 DIAGNOSIS — R270 Ataxia, unspecified: Secondary | ICD-10-CM | POA: Diagnosis not present

## 2024-05-27 DIAGNOSIS — R278 Other lack of coordination: Secondary | ICD-10-CM | POA: Diagnosis not present

## 2024-05-27 DIAGNOSIS — I639 Cerebral infarction, unspecified: Secondary | ICD-10-CM | POA: Diagnosis not present

## 2024-05-27 DIAGNOSIS — R279 Unspecified lack of coordination: Secondary | ICD-10-CM | POA: Diagnosis not present

## 2024-05-28 DIAGNOSIS — I639 Cerebral infarction, unspecified: Secondary | ICD-10-CM | POA: Diagnosis not present

## 2024-05-28 DIAGNOSIS — R278 Other lack of coordination: Secondary | ICD-10-CM | POA: Diagnosis not present

## 2024-05-28 DIAGNOSIS — M5000 Cervical disc disorder with myelopathy, unspecified cervical region: Secondary | ICD-10-CM | POA: Diagnosis not present

## 2024-05-28 DIAGNOSIS — R270 Ataxia, unspecified: Secondary | ICD-10-CM | POA: Diagnosis not present

## 2024-05-28 DIAGNOSIS — M6281 Muscle weakness (generalized): Secondary | ICD-10-CM | POA: Diagnosis not present

## 2024-05-28 DIAGNOSIS — R279 Unspecified lack of coordination: Secondary | ICD-10-CM | POA: Diagnosis not present

## 2024-05-29 DIAGNOSIS — R270 Ataxia, unspecified: Secondary | ICD-10-CM | POA: Diagnosis not present

## 2024-05-29 DIAGNOSIS — R279 Unspecified lack of coordination: Secondary | ICD-10-CM | POA: Diagnosis not present

## 2024-05-29 DIAGNOSIS — M5000 Cervical disc disorder with myelopathy, unspecified cervical region: Secondary | ICD-10-CM | POA: Diagnosis not present

## 2024-05-29 DIAGNOSIS — M6281 Muscle weakness (generalized): Secondary | ICD-10-CM | POA: Diagnosis not present

## 2024-05-29 DIAGNOSIS — R278 Other lack of coordination: Secondary | ICD-10-CM | POA: Diagnosis not present

## 2024-05-29 DIAGNOSIS — I639 Cerebral infarction, unspecified: Secondary | ICD-10-CM | POA: Diagnosis not present

## 2024-06-01 DIAGNOSIS — R279 Unspecified lack of coordination: Secondary | ICD-10-CM | POA: Diagnosis not present

## 2024-06-01 DIAGNOSIS — R278 Other lack of coordination: Secondary | ICD-10-CM | POA: Diagnosis not present

## 2024-06-01 DIAGNOSIS — M6281 Muscle weakness (generalized): Secondary | ICD-10-CM | POA: Diagnosis not present

## 2024-06-01 DIAGNOSIS — I639 Cerebral infarction, unspecified: Secondary | ICD-10-CM | POA: Diagnosis not present

## 2024-06-01 DIAGNOSIS — M5000 Cervical disc disorder with myelopathy, unspecified cervical region: Secondary | ICD-10-CM | POA: Diagnosis not present

## 2024-06-01 DIAGNOSIS — R270 Ataxia, unspecified: Secondary | ICD-10-CM | POA: Diagnosis not present

## 2024-06-02 DIAGNOSIS — I639 Cerebral infarction, unspecified: Secondary | ICD-10-CM | POA: Diagnosis not present

## 2024-06-02 DIAGNOSIS — M6281 Muscle weakness (generalized): Secondary | ICD-10-CM | POA: Diagnosis not present

## 2024-06-02 DIAGNOSIS — R278 Other lack of coordination: Secondary | ICD-10-CM | POA: Diagnosis not present

## 2024-06-02 DIAGNOSIS — R279 Unspecified lack of coordination: Secondary | ICD-10-CM | POA: Diagnosis not present

## 2024-06-02 DIAGNOSIS — R270 Ataxia, unspecified: Secondary | ICD-10-CM | POA: Diagnosis not present

## 2024-06-02 DIAGNOSIS — M5000 Cervical disc disorder with myelopathy, unspecified cervical region: Secondary | ICD-10-CM | POA: Diagnosis not present

## 2024-06-03 DIAGNOSIS — I639 Cerebral infarction, unspecified: Secondary | ICD-10-CM | POA: Diagnosis not present

## 2024-06-03 DIAGNOSIS — J449 Chronic obstructive pulmonary disease, unspecified: Secondary | ICD-10-CM | POA: Diagnosis not present

## 2024-06-03 DIAGNOSIS — M6281 Muscle weakness (generalized): Secondary | ICD-10-CM | POA: Diagnosis not present

## 2024-06-03 DIAGNOSIS — E782 Mixed hyperlipidemia: Secondary | ICD-10-CM | POA: Diagnosis not present

## 2024-06-03 DIAGNOSIS — R278 Other lack of coordination: Secondary | ICD-10-CM | POA: Diagnosis not present

## 2024-06-03 DIAGNOSIS — R270 Ataxia, unspecified: Secondary | ICD-10-CM | POA: Diagnosis not present

## 2024-06-03 DIAGNOSIS — R279 Unspecified lack of coordination: Secondary | ICD-10-CM | POA: Diagnosis not present

## 2024-06-03 DIAGNOSIS — M5000 Cervical disc disorder with myelopathy, unspecified cervical region: Secondary | ICD-10-CM | POA: Diagnosis not present

## 2024-06-04 DIAGNOSIS — R270 Ataxia, unspecified: Secondary | ICD-10-CM | POA: Diagnosis not present

## 2024-06-04 DIAGNOSIS — J449 Chronic obstructive pulmonary disease, unspecified: Secondary | ICD-10-CM | POA: Diagnosis not present

## 2024-06-04 DIAGNOSIS — M6281 Muscle weakness (generalized): Secondary | ICD-10-CM | POA: Diagnosis not present

## 2024-06-04 DIAGNOSIS — I639 Cerebral infarction, unspecified: Secondary | ICD-10-CM | POA: Diagnosis not present

## 2024-06-04 DIAGNOSIS — R278 Other lack of coordination: Secondary | ICD-10-CM | POA: Diagnosis not present

## 2024-06-04 DIAGNOSIS — R279 Unspecified lack of coordination: Secondary | ICD-10-CM | POA: Diagnosis not present

## 2024-06-04 DIAGNOSIS — M5000 Cervical disc disorder with myelopathy, unspecified cervical region: Secondary | ICD-10-CM | POA: Diagnosis not present

## 2024-06-05 DIAGNOSIS — M6281 Muscle weakness (generalized): Secondary | ICD-10-CM | POA: Diagnosis not present

## 2024-06-05 DIAGNOSIS — M5000 Cervical disc disorder with myelopathy, unspecified cervical region: Secondary | ICD-10-CM | POA: Diagnosis not present

## 2024-06-05 DIAGNOSIS — R279 Unspecified lack of coordination: Secondary | ICD-10-CM | POA: Diagnosis not present

## 2024-06-05 DIAGNOSIS — R270 Ataxia, unspecified: Secondary | ICD-10-CM | POA: Diagnosis not present

## 2024-06-05 DIAGNOSIS — R278 Other lack of coordination: Secondary | ICD-10-CM | POA: Diagnosis not present

## 2024-06-05 DIAGNOSIS — I639 Cerebral infarction, unspecified: Secondary | ICD-10-CM | POA: Diagnosis not present

## 2024-06-08 DIAGNOSIS — M5000 Cervical disc disorder with myelopathy, unspecified cervical region: Secondary | ICD-10-CM | POA: Diagnosis not present

## 2024-06-08 DIAGNOSIS — R279 Unspecified lack of coordination: Secondary | ICD-10-CM | POA: Diagnosis not present

## 2024-06-08 DIAGNOSIS — R270 Ataxia, unspecified: Secondary | ICD-10-CM | POA: Diagnosis not present

## 2024-06-08 DIAGNOSIS — I639 Cerebral infarction, unspecified: Secondary | ICD-10-CM | POA: Diagnosis not present

## 2024-06-08 DIAGNOSIS — R278 Other lack of coordination: Secondary | ICD-10-CM | POA: Diagnosis not present

## 2024-06-08 DIAGNOSIS — M6281 Muscle weakness (generalized): Secondary | ICD-10-CM | POA: Diagnosis not present

## 2024-06-09 DIAGNOSIS — R278 Other lack of coordination: Secondary | ICD-10-CM | POA: Diagnosis not present

## 2024-06-09 DIAGNOSIS — M5000 Cervical disc disorder with myelopathy, unspecified cervical region: Secondary | ICD-10-CM | POA: Diagnosis not present

## 2024-06-09 DIAGNOSIS — M6281 Muscle weakness (generalized): Secondary | ICD-10-CM | POA: Diagnosis not present

## 2024-06-09 DIAGNOSIS — I639 Cerebral infarction, unspecified: Secondary | ICD-10-CM | POA: Diagnosis not present

## 2024-06-09 DIAGNOSIS — R270 Ataxia, unspecified: Secondary | ICD-10-CM | POA: Diagnosis not present

## 2024-06-09 DIAGNOSIS — R279 Unspecified lack of coordination: Secondary | ICD-10-CM | POA: Diagnosis not present

## 2024-06-10 DIAGNOSIS — R279 Unspecified lack of coordination: Secondary | ICD-10-CM | POA: Diagnosis not present

## 2024-06-10 DIAGNOSIS — M5000 Cervical disc disorder with myelopathy, unspecified cervical region: Secondary | ICD-10-CM | POA: Diagnosis not present

## 2024-06-10 DIAGNOSIS — R278 Other lack of coordination: Secondary | ICD-10-CM | POA: Diagnosis not present

## 2024-06-10 DIAGNOSIS — R270 Ataxia, unspecified: Secondary | ICD-10-CM | POA: Diagnosis not present

## 2024-06-10 DIAGNOSIS — M6281 Muscle weakness (generalized): Secondary | ICD-10-CM | POA: Diagnosis not present

## 2024-06-10 DIAGNOSIS — I639 Cerebral infarction, unspecified: Secondary | ICD-10-CM | POA: Diagnosis not present

## 2024-06-11 DIAGNOSIS — M5000 Cervical disc disorder with myelopathy, unspecified cervical region: Secondary | ICD-10-CM | POA: Diagnosis not present

## 2024-06-11 DIAGNOSIS — I639 Cerebral infarction, unspecified: Secondary | ICD-10-CM | POA: Diagnosis not present

## 2024-06-11 DIAGNOSIS — R279 Unspecified lack of coordination: Secondary | ICD-10-CM | POA: Diagnosis not present

## 2024-06-11 DIAGNOSIS — M6281 Muscle weakness (generalized): Secondary | ICD-10-CM | POA: Diagnosis not present

## 2024-06-11 DIAGNOSIS — R278 Other lack of coordination: Secondary | ICD-10-CM | POA: Diagnosis not present

## 2024-06-11 DIAGNOSIS — R270 Ataxia, unspecified: Secondary | ICD-10-CM | POA: Diagnosis not present

## 2024-06-12 DIAGNOSIS — M5000 Cervical disc disorder with myelopathy, unspecified cervical region: Secondary | ICD-10-CM | POA: Diagnosis not present

## 2024-06-12 DIAGNOSIS — I639 Cerebral infarction, unspecified: Secondary | ICD-10-CM | POA: Diagnosis not present

## 2024-06-12 DIAGNOSIS — M6281 Muscle weakness (generalized): Secondary | ICD-10-CM | POA: Diagnosis not present

## 2024-06-12 DIAGNOSIS — R270 Ataxia, unspecified: Secondary | ICD-10-CM | POA: Diagnosis not present

## 2024-06-12 DIAGNOSIS — R278 Other lack of coordination: Secondary | ICD-10-CM | POA: Diagnosis not present

## 2024-06-12 DIAGNOSIS — R279 Unspecified lack of coordination: Secondary | ICD-10-CM | POA: Diagnosis not present

## 2024-06-13 DIAGNOSIS — I639 Cerebral infarction, unspecified: Secondary | ICD-10-CM | POA: Diagnosis not present

## 2024-06-13 DIAGNOSIS — M5000 Cervical disc disorder with myelopathy, unspecified cervical region: Secondary | ICD-10-CM | POA: Diagnosis not present

## 2024-06-13 DIAGNOSIS — M6281 Muscle weakness (generalized): Secondary | ICD-10-CM | POA: Diagnosis not present

## 2024-06-13 DIAGNOSIS — R279 Unspecified lack of coordination: Secondary | ICD-10-CM | POA: Diagnosis not present

## 2024-06-13 DIAGNOSIS — R270 Ataxia, unspecified: Secondary | ICD-10-CM | POA: Diagnosis not present

## 2024-06-13 DIAGNOSIS — R278 Other lack of coordination: Secondary | ICD-10-CM | POA: Diagnosis not present

## 2024-06-15 DIAGNOSIS — R278 Other lack of coordination: Secondary | ICD-10-CM | POA: Diagnosis not present

## 2024-06-15 DIAGNOSIS — R270 Ataxia, unspecified: Secondary | ICD-10-CM | POA: Diagnosis not present

## 2024-06-15 DIAGNOSIS — I509 Heart failure, unspecified: Secondary | ICD-10-CM | POA: Diagnosis not present

## 2024-06-15 DIAGNOSIS — Z130 Encounter for screening for diseases of the blood and blood-forming organs and certain disorders involving the immune mechanism: Secondary | ICD-10-CM | POA: Diagnosis not present

## 2024-06-15 DIAGNOSIS — R279 Unspecified lack of coordination: Secondary | ICD-10-CM | POA: Diagnosis not present

## 2024-06-15 DIAGNOSIS — M6281 Muscle weakness (generalized): Secondary | ICD-10-CM | POA: Diagnosis not present

## 2024-06-15 DIAGNOSIS — I639 Cerebral infarction, unspecified: Secondary | ICD-10-CM | POA: Diagnosis not present

## 2024-06-15 DIAGNOSIS — Z136 Encounter for screening for cardiovascular disorders: Secondary | ICD-10-CM | POA: Diagnosis not present

## 2024-06-15 DIAGNOSIS — M5000 Cervical disc disorder with myelopathy, unspecified cervical region: Secondary | ICD-10-CM | POA: Diagnosis not present

## 2024-06-16 DIAGNOSIS — J449 Chronic obstructive pulmonary disease, unspecified: Secondary | ICD-10-CM | POA: Diagnosis not present

## 2024-06-16 DIAGNOSIS — R279 Unspecified lack of coordination: Secondary | ICD-10-CM | POA: Diagnosis not present

## 2024-06-16 DIAGNOSIS — M6281 Muscle weakness (generalized): Secondary | ICD-10-CM | POA: Diagnosis not present

## 2024-06-16 DIAGNOSIS — R278 Other lack of coordination: Secondary | ICD-10-CM | POA: Diagnosis not present

## 2024-06-16 DIAGNOSIS — R270 Ataxia, unspecified: Secondary | ICD-10-CM | POA: Diagnosis not present

## 2024-06-16 DIAGNOSIS — M5000 Cervical disc disorder with myelopathy, unspecified cervical region: Secondary | ICD-10-CM | POA: Diagnosis not present

## 2024-06-16 DIAGNOSIS — I639 Cerebral infarction, unspecified: Secondary | ICD-10-CM | POA: Diagnosis not present

## 2024-06-17 DIAGNOSIS — R279 Unspecified lack of coordination: Secondary | ICD-10-CM | POA: Diagnosis not present

## 2024-06-17 DIAGNOSIS — R278 Other lack of coordination: Secondary | ICD-10-CM | POA: Diagnosis not present

## 2024-06-17 DIAGNOSIS — M5000 Cervical disc disorder with myelopathy, unspecified cervical region: Secondary | ICD-10-CM | POA: Diagnosis not present

## 2024-06-17 DIAGNOSIS — R270 Ataxia, unspecified: Secondary | ICD-10-CM | POA: Diagnosis not present

## 2024-06-17 DIAGNOSIS — M6281 Muscle weakness (generalized): Secondary | ICD-10-CM | POA: Diagnosis not present

## 2024-06-17 DIAGNOSIS — I639 Cerebral infarction, unspecified: Secondary | ICD-10-CM | POA: Diagnosis not present

## 2024-06-18 DIAGNOSIS — R279 Unspecified lack of coordination: Secondary | ICD-10-CM | POA: Diagnosis not present

## 2024-06-18 DIAGNOSIS — I639 Cerebral infarction, unspecified: Secondary | ICD-10-CM | POA: Diagnosis not present

## 2024-06-18 DIAGNOSIS — R270 Ataxia, unspecified: Secondary | ICD-10-CM | POA: Diagnosis not present

## 2024-06-18 DIAGNOSIS — M5000 Cervical disc disorder with myelopathy, unspecified cervical region: Secondary | ICD-10-CM | POA: Diagnosis not present

## 2024-06-18 DIAGNOSIS — R278 Other lack of coordination: Secondary | ICD-10-CM | POA: Diagnosis not present

## 2024-06-18 DIAGNOSIS — M6281 Muscle weakness (generalized): Secondary | ICD-10-CM | POA: Diagnosis not present

## 2024-06-19 DIAGNOSIS — M6281 Muscle weakness (generalized): Secondary | ICD-10-CM | POA: Diagnosis not present

## 2024-06-19 DIAGNOSIS — I639 Cerebral infarction, unspecified: Secondary | ICD-10-CM | POA: Diagnosis not present

## 2024-06-19 DIAGNOSIS — R278 Other lack of coordination: Secondary | ICD-10-CM | POA: Diagnosis not present

## 2024-06-19 DIAGNOSIS — R279 Unspecified lack of coordination: Secondary | ICD-10-CM | POA: Diagnosis not present

## 2024-06-19 DIAGNOSIS — M5000 Cervical disc disorder with myelopathy, unspecified cervical region: Secondary | ICD-10-CM | POA: Diagnosis not present

## 2024-06-19 DIAGNOSIS — R270 Ataxia, unspecified: Secondary | ICD-10-CM | POA: Diagnosis not present

## 2024-06-20 DIAGNOSIS — M6281 Muscle weakness (generalized): Secondary | ICD-10-CM | POA: Diagnosis not present

## 2024-06-20 DIAGNOSIS — R278 Other lack of coordination: Secondary | ICD-10-CM | POA: Diagnosis not present

## 2024-06-20 DIAGNOSIS — R270 Ataxia, unspecified: Secondary | ICD-10-CM | POA: Diagnosis not present

## 2024-06-20 DIAGNOSIS — I639 Cerebral infarction, unspecified: Secondary | ICD-10-CM | POA: Diagnosis not present

## 2024-06-20 DIAGNOSIS — M5000 Cervical disc disorder with myelopathy, unspecified cervical region: Secondary | ICD-10-CM | POA: Diagnosis not present

## 2024-06-20 DIAGNOSIS — R279 Unspecified lack of coordination: Secondary | ICD-10-CM | POA: Diagnosis not present

## 2024-06-22 DIAGNOSIS — R278 Other lack of coordination: Secondary | ICD-10-CM | POA: Diagnosis not present

## 2024-06-22 DIAGNOSIS — R270 Ataxia, unspecified: Secondary | ICD-10-CM | POA: Diagnosis not present

## 2024-06-22 DIAGNOSIS — R279 Unspecified lack of coordination: Secondary | ICD-10-CM | POA: Diagnosis not present

## 2024-06-22 DIAGNOSIS — M6281 Muscle weakness (generalized): Secondary | ICD-10-CM | POA: Diagnosis not present

## 2024-06-22 DIAGNOSIS — H2513 Age-related nuclear cataract, bilateral: Secondary | ICD-10-CM | POA: Diagnosis not present

## 2024-06-22 DIAGNOSIS — H524 Presbyopia: Secondary | ICD-10-CM | POA: Diagnosis not present

## 2024-06-22 DIAGNOSIS — H04123 Dry eye syndrome of bilateral lacrimal glands: Secondary | ICD-10-CM | POA: Diagnosis not present

## 2024-06-22 DIAGNOSIS — I639 Cerebral infarction, unspecified: Secondary | ICD-10-CM | POA: Diagnosis not present

## 2024-06-22 DIAGNOSIS — M5000 Cervical disc disorder with myelopathy, unspecified cervical region: Secondary | ICD-10-CM | POA: Diagnosis not present

## 2024-06-23 DIAGNOSIS — R278 Other lack of coordination: Secondary | ICD-10-CM | POA: Diagnosis not present

## 2024-06-23 DIAGNOSIS — I639 Cerebral infarction, unspecified: Secondary | ICD-10-CM | POA: Diagnosis not present

## 2024-06-23 DIAGNOSIS — R270 Ataxia, unspecified: Secondary | ICD-10-CM | POA: Diagnosis not present

## 2024-06-23 DIAGNOSIS — M6281 Muscle weakness (generalized): Secondary | ICD-10-CM | POA: Diagnosis not present

## 2024-06-23 DIAGNOSIS — R279 Unspecified lack of coordination: Secondary | ICD-10-CM | POA: Diagnosis not present

## 2024-06-23 DIAGNOSIS — M5000 Cervical disc disorder with myelopathy, unspecified cervical region: Secondary | ICD-10-CM | POA: Diagnosis not present

## 2024-06-24 DIAGNOSIS — I639 Cerebral infarction, unspecified: Secondary | ICD-10-CM | POA: Diagnosis not present

## 2024-06-24 DIAGNOSIS — J449 Chronic obstructive pulmonary disease, unspecified: Secondary | ICD-10-CM | POA: Diagnosis not present

## 2024-06-24 DIAGNOSIS — M6281 Muscle weakness (generalized): Secondary | ICD-10-CM | POA: Diagnosis not present

## 2024-06-24 DIAGNOSIS — M5000 Cervical disc disorder with myelopathy, unspecified cervical region: Secondary | ICD-10-CM | POA: Diagnosis not present

## 2024-06-24 DIAGNOSIS — R279 Unspecified lack of coordination: Secondary | ICD-10-CM | POA: Diagnosis not present

## 2024-06-24 DIAGNOSIS — R270 Ataxia, unspecified: Secondary | ICD-10-CM | POA: Diagnosis not present

## 2024-06-24 DIAGNOSIS — R278 Other lack of coordination: Secondary | ICD-10-CM | POA: Diagnosis not present

## 2024-06-25 DIAGNOSIS — M6281 Muscle weakness (generalized): Secondary | ICD-10-CM | POA: Diagnosis not present

## 2024-06-25 DIAGNOSIS — R270 Ataxia, unspecified: Secondary | ICD-10-CM | POA: Diagnosis not present

## 2024-06-25 DIAGNOSIS — R279 Unspecified lack of coordination: Secondary | ICD-10-CM | POA: Diagnosis not present

## 2024-06-25 DIAGNOSIS — M5000 Cervical disc disorder with myelopathy, unspecified cervical region: Secondary | ICD-10-CM | POA: Diagnosis not present

## 2024-06-25 DIAGNOSIS — R278 Other lack of coordination: Secondary | ICD-10-CM | POA: Diagnosis not present

## 2024-06-25 DIAGNOSIS — I639 Cerebral infarction, unspecified: Secondary | ICD-10-CM | POA: Diagnosis not present

## 2024-06-26 DIAGNOSIS — R278 Other lack of coordination: Secondary | ICD-10-CM | POA: Diagnosis not present

## 2024-06-26 DIAGNOSIS — R279 Unspecified lack of coordination: Secondary | ICD-10-CM | POA: Diagnosis not present

## 2024-06-26 DIAGNOSIS — I639 Cerebral infarction, unspecified: Secondary | ICD-10-CM | POA: Diagnosis not present

## 2024-06-26 DIAGNOSIS — M5000 Cervical disc disorder with myelopathy, unspecified cervical region: Secondary | ICD-10-CM | POA: Diagnosis not present

## 2024-06-26 DIAGNOSIS — R270 Ataxia, unspecified: Secondary | ICD-10-CM | POA: Diagnosis not present

## 2024-06-26 DIAGNOSIS — M6281 Muscle weakness (generalized): Secondary | ICD-10-CM | POA: Diagnosis not present

## 2024-06-27 DIAGNOSIS — M6281 Muscle weakness (generalized): Secondary | ICD-10-CM | POA: Diagnosis not present

## 2024-06-27 DIAGNOSIS — R270 Ataxia, unspecified: Secondary | ICD-10-CM | POA: Diagnosis not present

## 2024-06-27 DIAGNOSIS — R278 Other lack of coordination: Secondary | ICD-10-CM | POA: Diagnosis not present

## 2024-06-27 DIAGNOSIS — I639 Cerebral infarction, unspecified: Secondary | ICD-10-CM | POA: Diagnosis not present

## 2024-06-27 DIAGNOSIS — M5000 Cervical disc disorder with myelopathy, unspecified cervical region: Secondary | ICD-10-CM | POA: Diagnosis not present

## 2024-06-27 DIAGNOSIS — R279 Unspecified lack of coordination: Secondary | ICD-10-CM | POA: Diagnosis not present

## 2024-06-30 DIAGNOSIS — R270 Ataxia, unspecified: Secondary | ICD-10-CM | POA: Diagnosis not present

## 2024-06-30 DIAGNOSIS — M5000 Cervical disc disorder with myelopathy, unspecified cervical region: Secondary | ICD-10-CM | POA: Diagnosis not present

## 2024-06-30 DIAGNOSIS — I639 Cerebral infarction, unspecified: Secondary | ICD-10-CM | POA: Diagnosis not present

## 2024-06-30 DIAGNOSIS — R278 Other lack of coordination: Secondary | ICD-10-CM | POA: Diagnosis not present

## 2024-06-30 DIAGNOSIS — M6281 Muscle weakness (generalized): Secondary | ICD-10-CM | POA: Diagnosis not present

## 2024-06-30 DIAGNOSIS — R279 Unspecified lack of coordination: Secondary | ICD-10-CM | POA: Diagnosis not present

## 2024-07-01 DIAGNOSIS — R270 Ataxia, unspecified: Secondary | ICD-10-CM | POA: Diagnosis not present

## 2024-07-01 DIAGNOSIS — I639 Cerebral infarction, unspecified: Secondary | ICD-10-CM | POA: Diagnosis not present

## 2024-07-01 DIAGNOSIS — R278 Other lack of coordination: Secondary | ICD-10-CM | POA: Diagnosis not present

## 2024-07-01 DIAGNOSIS — M6281 Muscle weakness (generalized): Secondary | ICD-10-CM | POA: Diagnosis not present

## 2024-07-01 DIAGNOSIS — M5000 Cervical disc disorder with myelopathy, unspecified cervical region: Secondary | ICD-10-CM | POA: Diagnosis not present

## 2024-07-01 DIAGNOSIS — R279 Unspecified lack of coordination: Secondary | ICD-10-CM | POA: Diagnosis not present

## 2024-07-02 DIAGNOSIS — M6281 Muscle weakness (generalized): Secondary | ICD-10-CM | POA: Diagnosis not present

## 2024-07-02 DIAGNOSIS — R279 Unspecified lack of coordination: Secondary | ICD-10-CM | POA: Diagnosis not present

## 2024-07-02 DIAGNOSIS — M5000 Cervical disc disorder with myelopathy, unspecified cervical region: Secondary | ICD-10-CM | POA: Diagnosis not present

## 2024-07-02 DIAGNOSIS — R270 Ataxia, unspecified: Secondary | ICD-10-CM | POA: Diagnosis not present

## 2024-07-02 DIAGNOSIS — I639 Cerebral infarction, unspecified: Secondary | ICD-10-CM | POA: Diagnosis not present

## 2024-07-02 DIAGNOSIS — R278 Other lack of coordination: Secondary | ICD-10-CM | POA: Diagnosis not present

## 2024-07-03 DIAGNOSIS — M6281 Muscle weakness (generalized): Secondary | ICD-10-CM | POA: Diagnosis not present

## 2024-07-03 DIAGNOSIS — I639 Cerebral infarction, unspecified: Secondary | ICD-10-CM | POA: Diagnosis not present

## 2024-07-03 DIAGNOSIS — M5000 Cervical disc disorder with myelopathy, unspecified cervical region: Secondary | ICD-10-CM | POA: Diagnosis not present

## 2024-07-03 DIAGNOSIS — R279 Unspecified lack of coordination: Secondary | ICD-10-CM | POA: Diagnosis not present

## 2024-07-03 DIAGNOSIS — R270 Ataxia, unspecified: Secondary | ICD-10-CM | POA: Diagnosis not present

## 2024-07-03 DIAGNOSIS — R278 Other lack of coordination: Secondary | ICD-10-CM | POA: Diagnosis not present

## 2024-07-04 DIAGNOSIS — R278 Other lack of coordination: Secondary | ICD-10-CM | POA: Diagnosis not present

## 2024-07-04 DIAGNOSIS — R270 Ataxia, unspecified: Secondary | ICD-10-CM | POA: Diagnosis not present

## 2024-07-04 DIAGNOSIS — M5000 Cervical disc disorder with myelopathy, unspecified cervical region: Secondary | ICD-10-CM | POA: Diagnosis not present

## 2024-07-04 DIAGNOSIS — R279 Unspecified lack of coordination: Secondary | ICD-10-CM | POA: Diagnosis not present

## 2024-07-04 DIAGNOSIS — I639 Cerebral infarction, unspecified: Secondary | ICD-10-CM | POA: Diagnosis not present

## 2024-07-04 DIAGNOSIS — M6281 Muscle weakness (generalized): Secondary | ICD-10-CM | POA: Diagnosis not present

## 2024-07-06 DIAGNOSIS — R270 Ataxia, unspecified: Secondary | ICD-10-CM | POA: Diagnosis not present

## 2024-07-06 DIAGNOSIS — I639 Cerebral infarction, unspecified: Secondary | ICD-10-CM | POA: Diagnosis not present

## 2024-07-06 DIAGNOSIS — R279 Unspecified lack of coordination: Secondary | ICD-10-CM | POA: Diagnosis not present

## 2024-07-06 DIAGNOSIS — M6281 Muscle weakness (generalized): Secondary | ICD-10-CM | POA: Diagnosis not present

## 2024-07-06 DIAGNOSIS — M5000 Cervical disc disorder with myelopathy, unspecified cervical region: Secondary | ICD-10-CM | POA: Diagnosis not present

## 2024-07-06 DIAGNOSIS — R278 Other lack of coordination: Secondary | ICD-10-CM | POA: Diagnosis not present

## 2024-07-07 DIAGNOSIS — R278 Other lack of coordination: Secondary | ICD-10-CM | POA: Diagnosis not present

## 2024-07-07 DIAGNOSIS — R279 Unspecified lack of coordination: Secondary | ICD-10-CM | POA: Diagnosis not present

## 2024-07-07 DIAGNOSIS — R270 Ataxia, unspecified: Secondary | ICD-10-CM | POA: Diagnosis not present

## 2024-07-07 DIAGNOSIS — M5000 Cervical disc disorder with myelopathy, unspecified cervical region: Secondary | ICD-10-CM | POA: Diagnosis not present

## 2024-07-07 DIAGNOSIS — M6281 Muscle weakness (generalized): Secondary | ICD-10-CM | POA: Diagnosis not present

## 2024-07-07 DIAGNOSIS — I639 Cerebral infarction, unspecified: Secondary | ICD-10-CM | POA: Diagnosis not present

## 2024-07-08 DIAGNOSIS — M5000 Cervical disc disorder with myelopathy, unspecified cervical region: Secondary | ICD-10-CM | POA: Diagnosis not present

## 2024-07-08 DIAGNOSIS — I639 Cerebral infarction, unspecified: Secondary | ICD-10-CM | POA: Diagnosis not present

## 2024-07-08 DIAGNOSIS — R279 Unspecified lack of coordination: Secondary | ICD-10-CM | POA: Diagnosis not present

## 2024-07-08 DIAGNOSIS — R278 Other lack of coordination: Secondary | ICD-10-CM | POA: Diagnosis not present

## 2024-07-08 DIAGNOSIS — R270 Ataxia, unspecified: Secondary | ICD-10-CM | POA: Diagnosis not present

## 2024-07-08 DIAGNOSIS — M6281 Muscle weakness (generalized): Secondary | ICD-10-CM | POA: Diagnosis not present

## 2024-07-09 DIAGNOSIS — M6281 Muscle weakness (generalized): Secondary | ICD-10-CM | POA: Diagnosis not present

## 2024-07-09 DIAGNOSIS — R270 Ataxia, unspecified: Secondary | ICD-10-CM | POA: Diagnosis not present

## 2024-07-09 DIAGNOSIS — I639 Cerebral infarction, unspecified: Secondary | ICD-10-CM | POA: Diagnosis not present

## 2024-07-09 DIAGNOSIS — R279 Unspecified lack of coordination: Secondary | ICD-10-CM | POA: Diagnosis not present

## 2024-07-09 DIAGNOSIS — R278 Other lack of coordination: Secondary | ICD-10-CM | POA: Diagnosis not present

## 2024-07-09 DIAGNOSIS — M5000 Cervical disc disorder with myelopathy, unspecified cervical region: Secondary | ICD-10-CM | POA: Diagnosis not present

## 2024-07-10 DIAGNOSIS — M5000 Cervical disc disorder with myelopathy, unspecified cervical region: Secondary | ICD-10-CM | POA: Diagnosis not present

## 2024-07-10 DIAGNOSIS — R270 Ataxia, unspecified: Secondary | ICD-10-CM | POA: Diagnosis not present

## 2024-07-10 DIAGNOSIS — M6281 Muscle weakness (generalized): Secondary | ICD-10-CM | POA: Diagnosis not present

## 2024-07-10 DIAGNOSIS — I639 Cerebral infarction, unspecified: Secondary | ICD-10-CM | POA: Diagnosis not present

## 2024-07-10 DIAGNOSIS — R279 Unspecified lack of coordination: Secondary | ICD-10-CM | POA: Diagnosis not present

## 2024-07-10 DIAGNOSIS — R278 Other lack of coordination: Secondary | ICD-10-CM | POA: Diagnosis not present

## 2024-07-11 DIAGNOSIS — M5000 Cervical disc disorder with myelopathy, unspecified cervical region: Secondary | ICD-10-CM | POA: Diagnosis not present

## 2024-07-11 DIAGNOSIS — M6281 Muscle weakness (generalized): Secondary | ICD-10-CM | POA: Diagnosis not present

## 2024-07-11 DIAGNOSIS — R270 Ataxia, unspecified: Secondary | ICD-10-CM | POA: Diagnosis not present

## 2024-07-11 DIAGNOSIS — R279 Unspecified lack of coordination: Secondary | ICD-10-CM | POA: Diagnosis not present

## 2024-07-11 DIAGNOSIS — R278 Other lack of coordination: Secondary | ICD-10-CM | POA: Diagnosis not present

## 2024-07-11 DIAGNOSIS — I639 Cerebral infarction, unspecified: Secondary | ICD-10-CM | POA: Diagnosis not present

## 2024-07-13 DIAGNOSIS — R279 Unspecified lack of coordination: Secondary | ICD-10-CM | POA: Diagnosis not present

## 2024-07-13 DIAGNOSIS — M5000 Cervical disc disorder with myelopathy, unspecified cervical region: Secondary | ICD-10-CM | POA: Diagnosis not present

## 2024-07-13 DIAGNOSIS — R278 Other lack of coordination: Secondary | ICD-10-CM | POA: Diagnosis not present

## 2024-07-13 DIAGNOSIS — I639 Cerebral infarction, unspecified: Secondary | ICD-10-CM | POA: Diagnosis not present

## 2024-07-13 DIAGNOSIS — R270 Ataxia, unspecified: Secondary | ICD-10-CM | POA: Diagnosis not present

## 2024-07-14 DIAGNOSIS — M5000 Cervical disc disorder with myelopathy, unspecified cervical region: Secondary | ICD-10-CM | POA: Diagnosis not present

## 2024-07-14 DIAGNOSIS — R270 Ataxia, unspecified: Secondary | ICD-10-CM | POA: Diagnosis not present

## 2024-07-14 DIAGNOSIS — R278 Other lack of coordination: Secondary | ICD-10-CM | POA: Diagnosis not present

## 2024-07-14 DIAGNOSIS — R279 Unspecified lack of coordination: Secondary | ICD-10-CM | POA: Diagnosis not present

## 2024-07-14 DIAGNOSIS — I639 Cerebral infarction, unspecified: Secondary | ICD-10-CM | POA: Diagnosis not present

## 2024-07-14 DIAGNOSIS — M6281 Muscle weakness (generalized): Secondary | ICD-10-CM | POA: Diagnosis not present

## 2024-07-15 DIAGNOSIS — I639 Cerebral infarction, unspecified: Secondary | ICD-10-CM | POA: Diagnosis not present

## 2024-07-15 DIAGNOSIS — M5000 Cervical disc disorder with myelopathy, unspecified cervical region: Secondary | ICD-10-CM | POA: Diagnosis not present

## 2024-07-15 DIAGNOSIS — R279 Unspecified lack of coordination: Secondary | ICD-10-CM | POA: Diagnosis not present

## 2024-07-15 DIAGNOSIS — R270 Ataxia, unspecified: Secondary | ICD-10-CM | POA: Diagnosis not present

## 2024-07-15 DIAGNOSIS — R278 Other lack of coordination: Secondary | ICD-10-CM | POA: Diagnosis not present

## 2024-07-16 DIAGNOSIS — M6281 Muscle weakness (generalized): Secondary | ICD-10-CM | POA: Diagnosis not present

## 2024-07-16 DIAGNOSIS — M5000 Cervical disc disorder with myelopathy, unspecified cervical region: Secondary | ICD-10-CM | POA: Diagnosis not present

## 2024-07-16 DIAGNOSIS — R270 Ataxia, unspecified: Secondary | ICD-10-CM | POA: Diagnosis not present

## 2024-07-16 DIAGNOSIS — I639 Cerebral infarction, unspecified: Secondary | ICD-10-CM | POA: Diagnosis not present

## 2024-07-16 DIAGNOSIS — R278 Other lack of coordination: Secondary | ICD-10-CM | POA: Diagnosis not present

## 2024-07-16 DIAGNOSIS — R279 Unspecified lack of coordination: Secondary | ICD-10-CM | POA: Diagnosis not present

## 2024-07-17 DIAGNOSIS — R278 Other lack of coordination: Secondary | ICD-10-CM | POA: Diagnosis not present

## 2024-07-17 DIAGNOSIS — R270 Ataxia, unspecified: Secondary | ICD-10-CM | POA: Diagnosis not present

## 2024-07-17 DIAGNOSIS — I639 Cerebral infarction, unspecified: Secondary | ICD-10-CM | POA: Diagnosis not present

## 2024-07-17 DIAGNOSIS — R279 Unspecified lack of coordination: Secondary | ICD-10-CM | POA: Diagnosis not present

## 2024-07-17 DIAGNOSIS — M6281 Muscle weakness (generalized): Secondary | ICD-10-CM | POA: Diagnosis not present

## 2024-07-17 DIAGNOSIS — M5000 Cervical disc disorder with myelopathy, unspecified cervical region: Secondary | ICD-10-CM | POA: Diagnosis not present

## 2024-07-18 DIAGNOSIS — R278 Other lack of coordination: Secondary | ICD-10-CM | POA: Diagnosis not present

## 2024-07-18 DIAGNOSIS — M5000 Cervical disc disorder with myelopathy, unspecified cervical region: Secondary | ICD-10-CM | POA: Diagnosis not present

## 2024-07-18 DIAGNOSIS — R270 Ataxia, unspecified: Secondary | ICD-10-CM | POA: Diagnosis not present

## 2024-07-18 DIAGNOSIS — R279 Unspecified lack of coordination: Secondary | ICD-10-CM | POA: Diagnosis not present

## 2024-07-18 DIAGNOSIS — I639 Cerebral infarction, unspecified: Secondary | ICD-10-CM | POA: Diagnosis not present

## 2024-07-20 DIAGNOSIS — R270 Ataxia, unspecified: Secondary | ICD-10-CM | POA: Diagnosis not present

## 2024-07-20 DIAGNOSIS — I639 Cerebral infarction, unspecified: Secondary | ICD-10-CM | POA: Diagnosis not present

## 2024-07-20 DIAGNOSIS — R278 Other lack of coordination: Secondary | ICD-10-CM | POA: Diagnosis not present

## 2024-07-20 DIAGNOSIS — R279 Unspecified lack of coordination: Secondary | ICD-10-CM | POA: Diagnosis not present

## 2024-07-20 DIAGNOSIS — M6281 Muscle weakness (generalized): Secondary | ICD-10-CM | POA: Diagnosis not present

## 2024-07-20 DIAGNOSIS — M5000 Cervical disc disorder with myelopathy, unspecified cervical region: Secondary | ICD-10-CM | POA: Diagnosis not present

## 2024-07-21 DIAGNOSIS — M5000 Cervical disc disorder with myelopathy, unspecified cervical region: Secondary | ICD-10-CM | POA: Diagnosis not present

## 2024-07-21 DIAGNOSIS — H2513 Age-related nuclear cataract, bilateral: Secondary | ICD-10-CM | POA: Diagnosis not present

## 2024-07-21 DIAGNOSIS — R279 Unspecified lack of coordination: Secondary | ICD-10-CM | POA: Diagnosis not present

## 2024-07-21 DIAGNOSIS — H353131 Nonexudative age-related macular degeneration, bilateral, early dry stage: Secondary | ICD-10-CM | POA: Diagnosis not present

## 2024-07-21 DIAGNOSIS — R270 Ataxia, unspecified: Secondary | ICD-10-CM | POA: Diagnosis not present

## 2024-07-21 DIAGNOSIS — R278 Other lack of coordination: Secondary | ICD-10-CM | POA: Diagnosis not present

## 2024-07-21 DIAGNOSIS — I639 Cerebral infarction, unspecified: Secondary | ICD-10-CM | POA: Diagnosis not present

## 2024-07-22 DIAGNOSIS — M6281 Muscle weakness (generalized): Secondary | ICD-10-CM | POA: Diagnosis not present

## 2024-07-22 DIAGNOSIS — M5000 Cervical disc disorder with myelopathy, unspecified cervical region: Secondary | ICD-10-CM | POA: Diagnosis not present

## 2024-07-22 DIAGNOSIS — I639 Cerebral infarction, unspecified: Secondary | ICD-10-CM | POA: Diagnosis not present

## 2024-07-22 DIAGNOSIS — R270 Ataxia, unspecified: Secondary | ICD-10-CM | POA: Diagnosis not present

## 2024-07-22 DIAGNOSIS — R278 Other lack of coordination: Secondary | ICD-10-CM | POA: Diagnosis not present

## 2024-07-22 DIAGNOSIS — J449 Chronic obstructive pulmonary disease, unspecified: Secondary | ICD-10-CM | POA: Diagnosis not present

## 2024-07-22 DIAGNOSIS — R279 Unspecified lack of coordination: Secondary | ICD-10-CM | POA: Diagnosis not present

## 2024-07-23 DIAGNOSIS — R270 Ataxia, unspecified: Secondary | ICD-10-CM | POA: Diagnosis not present

## 2024-07-23 DIAGNOSIS — M5000 Cervical disc disorder with myelopathy, unspecified cervical region: Secondary | ICD-10-CM | POA: Diagnosis not present

## 2024-07-23 DIAGNOSIS — I639 Cerebral infarction, unspecified: Secondary | ICD-10-CM | POA: Diagnosis not present

## 2024-07-23 DIAGNOSIS — R279 Unspecified lack of coordination: Secondary | ICD-10-CM | POA: Diagnosis not present

## 2024-07-23 DIAGNOSIS — R278 Other lack of coordination: Secondary | ICD-10-CM | POA: Diagnosis not present

## 2024-07-24 DIAGNOSIS — R279 Unspecified lack of coordination: Secondary | ICD-10-CM | POA: Diagnosis not present

## 2024-07-24 DIAGNOSIS — R270 Ataxia, unspecified: Secondary | ICD-10-CM | POA: Diagnosis not present

## 2024-07-24 DIAGNOSIS — R278 Other lack of coordination: Secondary | ICD-10-CM | POA: Diagnosis not present

## 2024-07-24 DIAGNOSIS — M5000 Cervical disc disorder with myelopathy, unspecified cervical region: Secondary | ICD-10-CM | POA: Diagnosis not present

## 2024-07-24 DIAGNOSIS — I639 Cerebral infarction, unspecified: Secondary | ICD-10-CM | POA: Diagnosis not present

## 2024-07-27 DIAGNOSIS — M5000 Cervical disc disorder with myelopathy, unspecified cervical region: Secondary | ICD-10-CM | POA: Diagnosis not present

## 2024-07-27 DIAGNOSIS — R279 Unspecified lack of coordination: Secondary | ICD-10-CM | POA: Diagnosis not present

## 2024-07-27 DIAGNOSIS — M6281 Muscle weakness (generalized): Secondary | ICD-10-CM | POA: Diagnosis not present

## 2024-07-27 DIAGNOSIS — R278 Other lack of coordination: Secondary | ICD-10-CM | POA: Diagnosis not present

## 2024-07-27 DIAGNOSIS — R270 Ataxia, unspecified: Secondary | ICD-10-CM | POA: Diagnosis not present

## 2024-07-27 DIAGNOSIS — I639 Cerebral infarction, unspecified: Secondary | ICD-10-CM | POA: Diagnosis not present

## 2024-07-28 DIAGNOSIS — R278 Other lack of coordination: Secondary | ICD-10-CM | POA: Diagnosis not present

## 2024-07-28 DIAGNOSIS — M5000 Cervical disc disorder with myelopathy, unspecified cervical region: Secondary | ICD-10-CM | POA: Diagnosis not present

## 2024-07-28 DIAGNOSIS — R270 Ataxia, unspecified: Secondary | ICD-10-CM | POA: Diagnosis not present

## 2024-07-28 DIAGNOSIS — I639 Cerebral infarction, unspecified: Secondary | ICD-10-CM | POA: Diagnosis not present

## 2024-07-28 DIAGNOSIS — R279 Unspecified lack of coordination: Secondary | ICD-10-CM | POA: Diagnosis not present

## 2024-07-29 DIAGNOSIS — I639 Cerebral infarction, unspecified: Secondary | ICD-10-CM | POA: Diagnosis not present

## 2024-07-29 DIAGNOSIS — R279 Unspecified lack of coordination: Secondary | ICD-10-CM | POA: Diagnosis not present

## 2024-07-29 DIAGNOSIS — R278 Other lack of coordination: Secondary | ICD-10-CM | POA: Diagnosis not present

## 2024-07-29 DIAGNOSIS — M5000 Cervical disc disorder with myelopathy, unspecified cervical region: Secondary | ICD-10-CM | POA: Diagnosis not present

## 2024-07-29 DIAGNOSIS — R270 Ataxia, unspecified: Secondary | ICD-10-CM | POA: Diagnosis not present

## 2024-07-30 DIAGNOSIS — I639 Cerebral infarction, unspecified: Secondary | ICD-10-CM | POA: Diagnosis not present

## 2024-07-30 DIAGNOSIS — M6281 Muscle weakness (generalized): Secondary | ICD-10-CM | POA: Diagnosis not present

## 2024-07-30 DIAGNOSIS — R270 Ataxia, unspecified: Secondary | ICD-10-CM | POA: Diagnosis not present

## 2024-07-30 DIAGNOSIS — R279 Unspecified lack of coordination: Secondary | ICD-10-CM | POA: Diagnosis not present

## 2024-07-30 DIAGNOSIS — R278 Other lack of coordination: Secondary | ICD-10-CM | POA: Diagnosis not present

## 2024-07-30 DIAGNOSIS — M5000 Cervical disc disorder with myelopathy, unspecified cervical region: Secondary | ICD-10-CM | POA: Diagnosis not present

## 2024-07-31 DIAGNOSIS — R279 Unspecified lack of coordination: Secondary | ICD-10-CM | POA: Diagnosis not present

## 2024-07-31 DIAGNOSIS — M5000 Cervical disc disorder with myelopathy, unspecified cervical region: Secondary | ICD-10-CM | POA: Diagnosis not present

## 2024-07-31 DIAGNOSIS — I639 Cerebral infarction, unspecified: Secondary | ICD-10-CM | POA: Diagnosis not present

## 2024-07-31 DIAGNOSIS — R278 Other lack of coordination: Secondary | ICD-10-CM | POA: Diagnosis not present

## 2024-07-31 DIAGNOSIS — R270 Ataxia, unspecified: Secondary | ICD-10-CM | POA: Diagnosis not present

## 2024-08-01 DIAGNOSIS — I639 Cerebral infarction, unspecified: Secondary | ICD-10-CM | POA: Diagnosis not present

## 2024-08-01 DIAGNOSIS — M5000 Cervical disc disorder with myelopathy, unspecified cervical region: Secondary | ICD-10-CM | POA: Diagnosis not present

## 2024-08-01 DIAGNOSIS — R278 Other lack of coordination: Secondary | ICD-10-CM | POA: Diagnosis not present

## 2024-08-01 DIAGNOSIS — R279 Unspecified lack of coordination: Secondary | ICD-10-CM | POA: Diagnosis not present

## 2024-08-01 DIAGNOSIS — M6281 Muscle weakness (generalized): Secondary | ICD-10-CM | POA: Diagnosis not present

## 2024-08-01 DIAGNOSIS — R270 Ataxia, unspecified: Secondary | ICD-10-CM | POA: Diagnosis not present

## 2024-08-03 DIAGNOSIS — R279 Unspecified lack of coordination: Secondary | ICD-10-CM | POA: Diagnosis not present

## 2024-08-03 DIAGNOSIS — R270 Ataxia, unspecified: Secondary | ICD-10-CM | POA: Diagnosis not present

## 2024-08-03 DIAGNOSIS — R278 Other lack of coordination: Secondary | ICD-10-CM | POA: Diagnosis not present

## 2024-08-03 DIAGNOSIS — M5000 Cervical disc disorder with myelopathy, unspecified cervical region: Secondary | ICD-10-CM | POA: Diagnosis not present

## 2024-08-03 DIAGNOSIS — I639 Cerebral infarction, unspecified: Secondary | ICD-10-CM | POA: Diagnosis not present

## 2024-08-04 DIAGNOSIS — R270 Ataxia, unspecified: Secondary | ICD-10-CM | POA: Diagnosis not present

## 2024-08-04 DIAGNOSIS — M6281 Muscle weakness (generalized): Secondary | ICD-10-CM | POA: Diagnosis not present

## 2024-08-04 DIAGNOSIS — I639 Cerebral infarction, unspecified: Secondary | ICD-10-CM | POA: Diagnosis not present

## 2024-08-04 DIAGNOSIS — M5000 Cervical disc disorder with myelopathy, unspecified cervical region: Secondary | ICD-10-CM | POA: Diagnosis not present

## 2024-08-04 DIAGNOSIS — R279 Unspecified lack of coordination: Secondary | ICD-10-CM | POA: Diagnosis not present

## 2024-08-04 DIAGNOSIS — R278 Other lack of coordination: Secondary | ICD-10-CM | POA: Diagnosis not present

## 2024-08-05 DIAGNOSIS — R270 Ataxia, unspecified: Secondary | ICD-10-CM | POA: Diagnosis not present

## 2024-08-05 DIAGNOSIS — R279 Unspecified lack of coordination: Secondary | ICD-10-CM | POA: Diagnosis not present

## 2024-08-05 DIAGNOSIS — I639 Cerebral infarction, unspecified: Secondary | ICD-10-CM | POA: Diagnosis not present

## 2024-08-05 DIAGNOSIS — R278 Other lack of coordination: Secondary | ICD-10-CM | POA: Diagnosis not present

## 2024-08-05 DIAGNOSIS — M5000 Cervical disc disorder with myelopathy, unspecified cervical region: Secondary | ICD-10-CM | POA: Diagnosis not present

## 2024-08-06 DIAGNOSIS — I639 Cerebral infarction, unspecified: Secondary | ICD-10-CM | POA: Diagnosis not present

## 2024-08-06 DIAGNOSIS — R270 Ataxia, unspecified: Secondary | ICD-10-CM | POA: Diagnosis not present

## 2024-08-06 DIAGNOSIS — M5000 Cervical disc disorder with myelopathy, unspecified cervical region: Secondary | ICD-10-CM | POA: Diagnosis not present

## 2024-08-06 DIAGNOSIS — R278 Other lack of coordination: Secondary | ICD-10-CM | POA: Diagnosis not present

## 2024-08-06 DIAGNOSIS — R279 Unspecified lack of coordination: Secondary | ICD-10-CM | POA: Diagnosis not present

## 2024-08-11 DIAGNOSIS — H401132 Primary open-angle glaucoma, bilateral, moderate stage: Secondary | ICD-10-CM | POA: Diagnosis not present

## 2024-08-19 DIAGNOSIS — L409 Psoriasis, unspecified: Secondary | ICD-10-CM | POA: Diagnosis not present

## 2024-08-19 DIAGNOSIS — I639 Cerebral infarction, unspecified: Secondary | ICD-10-CM | POA: Diagnosis not present

## 2024-08-19 DIAGNOSIS — J449 Chronic obstructive pulmonary disease, unspecified: Secondary | ICD-10-CM | POA: Diagnosis not present

## 2024-08-24 DIAGNOSIS — J441 Chronic obstructive pulmonary disease with (acute) exacerbation: Secondary | ICD-10-CM | POA: Diagnosis not present

## 2024-08-24 DIAGNOSIS — M62838 Other muscle spasm: Secondary | ICD-10-CM | POA: Diagnosis not present

## 2024-08-24 DIAGNOSIS — R062 Wheezing: Secondary | ICD-10-CM | POA: Diagnosis not present

## 2024-08-24 DIAGNOSIS — I639 Cerebral infarction, unspecified: Secondary | ICD-10-CM | POA: Diagnosis not present

## 2024-08-24 DIAGNOSIS — R0902 Hypoxemia: Secondary | ICD-10-CM | POA: Diagnosis not present

## 2024-08-25 DIAGNOSIS — Z630 Problems in relationship with spouse or partner: Secondary | ICD-10-CM | POA: Diagnosis not present

## 2024-08-25 DIAGNOSIS — I10 Essential (primary) hypertension: Secondary | ICD-10-CM | POA: Diagnosis not present

## 2024-08-25 DIAGNOSIS — R079 Chest pain, unspecified: Secondary | ICD-10-CM | POA: Diagnosis not present

## 2024-08-25 DIAGNOSIS — I509 Heart failure, unspecified: Secondary | ICD-10-CM | POA: Diagnosis not present

## 2024-08-26 DIAGNOSIS — I639 Cerebral infarction, unspecified: Secondary | ICD-10-CM | POA: Diagnosis not present

## 2024-08-26 DIAGNOSIS — J449 Chronic obstructive pulmonary disease, unspecified: Secondary | ICD-10-CM | POA: Diagnosis not present

## 2024-08-27 DIAGNOSIS — R0902 Hypoxemia: Secondary | ICD-10-CM | POA: Diagnosis not present

## 2024-08-27 DIAGNOSIS — J441 Chronic obstructive pulmonary disease with (acute) exacerbation: Secondary | ICD-10-CM | POA: Diagnosis not present

## 2024-08-27 DIAGNOSIS — N179 Acute kidney failure, unspecified: Secondary | ICD-10-CM | POA: Diagnosis not present

## 2024-08-27 DIAGNOSIS — I639 Cerebral infarction, unspecified: Secondary | ICD-10-CM | POA: Diagnosis not present

## 2024-08-28 DIAGNOSIS — J449 Chronic obstructive pulmonary disease, unspecified: Secondary | ICD-10-CM | POA: Diagnosis not present

## 2024-08-28 DIAGNOSIS — J441 Chronic obstructive pulmonary disease with (acute) exacerbation: Secondary | ICD-10-CM | POA: Diagnosis not present

## 2024-08-28 DIAGNOSIS — I639 Cerebral infarction, unspecified: Secondary | ICD-10-CM | POA: Diagnosis not present

## 2024-08-28 DIAGNOSIS — R0902 Hypoxemia: Secondary | ICD-10-CM | POA: Diagnosis not present

## 2024-08-28 DIAGNOSIS — N179 Acute kidney failure, unspecified: Secondary | ICD-10-CM | POA: Diagnosis not present

## 2024-09-01 DIAGNOSIS — R0902 Hypoxemia: Secondary | ICD-10-CM | POA: Diagnosis not present

## 2024-09-01 DIAGNOSIS — I639 Cerebral infarction, unspecified: Secondary | ICD-10-CM | POA: Diagnosis not present

## 2024-09-01 DIAGNOSIS — E872 Acidosis, unspecified: Secondary | ICD-10-CM | POA: Diagnosis not present

## 2024-09-01 DIAGNOSIS — J441 Chronic obstructive pulmonary disease with (acute) exacerbation: Secondary | ICD-10-CM | POA: Diagnosis not present

## 2024-09-01 DIAGNOSIS — D649 Anemia, unspecified: Secondary | ICD-10-CM | POA: Diagnosis not present

## 2024-09-03 DIAGNOSIS — E782 Mixed hyperlipidemia: Secondary | ICD-10-CM | POA: Diagnosis not present

## 2024-09-03 DIAGNOSIS — R0902 Hypoxemia: Secondary | ICD-10-CM | POA: Diagnosis not present

## 2024-09-03 DIAGNOSIS — J441 Chronic obstructive pulmonary disease with (acute) exacerbation: Secondary | ICD-10-CM | POA: Diagnosis not present

## 2024-09-03 DIAGNOSIS — I639 Cerebral infarction, unspecified: Secondary | ICD-10-CM | POA: Diagnosis not present
